# Patient Record
Sex: Male | Born: 1950 | Race: Black or African American | Hispanic: No | Marital: Single | Smoking: Never smoker
Health system: Southern US, Community
[De-identification: ages and names within clinical notes are randomized; demographics above are authoritative.]

## PROBLEM LIST (undated history)

## (undated) DIAGNOSIS — K648 Other hemorrhoids: Secondary | ICD-10-CM

## (undated) DIAGNOSIS — T5891XA Toxic effect of carbon monoxide from unspecified source, accidental (unintentional), initial encounter: Secondary | ICD-10-CM

## (undated) DIAGNOSIS — R112 Nausea with vomiting, unspecified: Secondary | ICD-10-CM

## (undated) DIAGNOSIS — R339 Retention of urine, unspecified: Secondary | ICD-10-CM

## (undated) DIAGNOSIS — E785 Hyperlipidemia, unspecified: Secondary | ICD-10-CM

## (undated) DIAGNOSIS — R55 Syncope and collapse: Secondary | ICD-10-CM

## (undated) DIAGNOSIS — Z9889 Other specified postprocedural states: Secondary | ICD-10-CM

## (undated) HISTORY — DX: Hyperlipidemia, unspecified: E78.5

## (undated) HISTORY — PX: FINGER FRACTURE SURGERY: SHX638

## (undated) HISTORY — PX: CLOSED REDUCTION SHOULDER DISLOCATION: SUR242

## (undated) HISTORY — PX: PROSTATE BIOPSY: SHX241

## (undated) HISTORY — PX: FRACTURE SURGERY: SHX138

---

## 2003-09-24 ENCOUNTER — Emergency Department (HOSPITAL_COMMUNITY): Admission: EM | Admit: 2003-09-24 | Discharge: 2003-09-24 | Payer: Self-pay | Admitting: Emergency Medicine

## 2008-03-14 ENCOUNTER — Observation Stay (HOSPITAL_COMMUNITY): Admission: EM | Admit: 2008-03-14 | Discharge: 2008-03-15 | Payer: Self-pay | Admitting: Emergency Medicine

## 2008-03-16 ENCOUNTER — Emergency Department (HOSPITAL_COMMUNITY): Admission: EM | Admit: 2008-03-16 | Discharge: 2008-03-16 | Payer: Self-pay | Admitting: Emergency Medicine

## 2010-05-24 LAB — URINALYSIS, ROUTINE W REFLEX MICROSCOPIC
Bilirubin Urine: NEGATIVE
Bilirubin Urine: NEGATIVE
Glucose, UA: NEGATIVE mg/dL
Ketones, ur: NEGATIVE mg/dL
Ketones, ur: NEGATIVE mg/dL
Nitrite: NEGATIVE
Protein, ur: 30 mg/dL — AB
Specific Gravity, Urine: 1.013 (ref 1.005–1.030)
Specific Gravity, Urine: 1.02 (ref 1.005–1.030)
Urobilinogen, UA: 1 mg/dL (ref 0.0–1.0)
Urobilinogen, UA: 1 mg/dL (ref 0.0–1.0)
pH: 6.5 (ref 5.0–8.0)

## 2010-05-24 LAB — URINE MICROSCOPIC-ADD ON

## 2010-05-24 LAB — POCT I-STAT, CHEM 8
BUN: 12 mg/dL (ref 6–23)
Calcium, Ion: 1.19 mmol/L (ref 1.12–1.32)
Creatinine, Ser: 1.4 mg/dL (ref 0.4–1.5)
Glucose, Bld: 119 mg/dL — ABNORMAL HIGH (ref 70–99)
Hemoglobin: 13.9 g/dL (ref 13.0–17.0)
Sodium: 142 mEq/L (ref 135–145)
TCO2: 24 mmol/L (ref 0–100)

## 2010-05-24 LAB — CULTURE, ROUTINE-ABSCESS: Gram Stain: NONE SEEN

## 2010-05-24 LAB — DIFFERENTIAL
Basophils Absolute: 0 10*3/uL (ref 0.0–0.1)
Eosinophils Relative: 6 % — ABNORMAL HIGH (ref 0–5)
Lymphocytes Relative: 47 % — ABNORMAL HIGH (ref 12–46)
Lymphs Abs: 1.8 10*3/uL (ref 0.7–4.0)
Neutro Abs: 1.4 10*3/uL — ABNORMAL LOW (ref 1.7–7.7)
Neutrophils Relative %: 36 % — ABNORMAL LOW (ref 43–77)

## 2010-05-24 LAB — URINE CULTURE: Colony Count: NO GROWTH

## 2010-05-24 LAB — BASIC METABOLIC PANEL
BUN: 11 mg/dL (ref 6–23)
Calcium: 9.5 mg/dL (ref 8.4–10.5)
Creatinine, Ser: 1.01 mg/dL (ref 0.4–1.5)
GFR calc non Af Amer: >60 mL/min (ref >60)
Glucose, Bld: 93 mg/dL (ref 70–99)
Potassium: 3.7 mEq/L (ref 3.5–5.1)

## 2010-05-24 LAB — CBC
HCT: 40.9 % (ref 39.0–52.0)
Platelets: 188 10*3/uL (ref 150–400)
RDW: 15.1 % (ref 11.5–15.5)
WBC: 3.9 10*3/uL — ABNORMAL LOW (ref 4.0–10.5)

## 2010-06-21 NOTE — Op Note (Signed)
NAMEGLENDEL, Dan Mathis               ACCOUNT NO.:  [id number]   MEDICAL RECORD NO.:  [id number]          PATIENT TYPE:  INP   LOCATION:  2550                         FACILITY:  [facility]   PHYSICIAN:  Dionne Ano. Gramig, M.D.DATE OF BIRTH:  1950/12/28   DATE OF PROCEDURE:  DATE OF DISCHARGE:                               OPERATIVE REPORT   PREOPERATIVE DIAGNOSIS:  Late presentation of left index finger fracture  with laceration of the dorsal aspect, suspicious for open fracture/type  1 open fracture left index finger.   POSTOPERATIVE DIAGNOSIS:  Late presentation of left index finger  fracture with laceration of the dorsal aspect, suspicious for open  fracture/type 1 open fracture left index finger.   PROCEDURE:  1. I&D (irrigation and debridement) skin, subcutaneous tissue, tendon,      and bone.  This was an excisional debridement of a late      presentation of an open fracture.  2. Open reduction and internal fixation, left index finger fracture.  3. Stress radiography.   SURGEON:  Dionne Ano. Amanda Pea, MD   ASSISTANTS:  None.   COMPLICATIONS:  None.   ANESTHESIA:  General.   ESTIMATED BLOOD LOSS:  Minimal.   INDICATIONS FOR PROCEDURE:  The patient is a 60 year old male who  presents with the above-mentioned diagnosis.  I have counseled him in  regards to risks and benefits of surgery including risk of infection,  bleeding, anesthesia, damage to normal structures, and failure of  surgery to accomplish its intended goals of relieving symptoms and  restoring function.  With this in mind, he desired to proceed.  All  questions have been encouraged and answered preoperatively.   I should note the patient does have a late presentation, and it is  difficult to ascertain the nature of the injury and onset in terms of  the depth of penetration, but certainly he has areas with dorsal  laceration suspicious for an open fracture.  Given the displacement,  laceration, vicinity etc., I do  feel this is a late presentation of an  open fracture, which is not obviously infected at this point.  I have  discussed with the patient the need for I&D, ORIF, and repair of  structures as necessary.  With this in mind, the patient desires to  proceed.  All questions have been encouraged and answered  preoperatively.   OPERATIVE PROCEDURE:  The patient was seen by myself, anesthesia, taken  to the operative suite and underwent a smooth induction of anesthesia.  Preoperatively, time-out was called.  Consent verified and all questions  were encouraged and answered.  Once in the operative arena, the patient  underwent a smooth induction of general anesthesia.  He was laid supine,  appropriately padded, prepped and draped in usual sterile fashion,  Betadine scrub and paint about the left upper extremity.  This was  performed to my satisfaction without difficulty.  Following this, the  patient then underwent incision and excision on nature about the prior  dorsal laceration.  Dissection was carried down.  There was large amount  of abundant scar tissue.  There was no  abscess or gross purulence.  I  did culture this tissue.  He had exuberant periosteal tissue and  inflammation which was cultured.  Both aerobic and anaerobic cultures  were taken.  I&D the area copiously with 3 L of saline.   Following this, I then performed open reduction of the middle phalanx  fracture.  This was secured with a 0.035 K-wire entering at the tip of  the finger and crossing the DIP joint securing the fracture.  This  provided excellent fixation.  The was placed underneath the skin for  later removal.  This was done without difficulty.  Stress radiography  revealed excellent position.  PIP joint was stable.  There were no  complicating features.   The patient tolerated the procedure well.  He was dressed sterilely with  Xeroform and a splint after sterile dressing was placed.  He was taken  to the recovery  room in stable condition.  He will be discharged home on  antibiotics in the form of Bactrim and Keflex, Percocet for pain,  vitamin C, Peri-Colace according to my usual routine and return to the  office to see me in 7 days.  I have discussed with him the relevant do's  and don'ts etc., and all questions of course have been encouraged and  answered.  It was a pleasure to see him today.  Hopefully, this will  return him to quiescent state of affairs.  He understands and he needs  to keep this clean and dry.  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  It was a  pleasure to see him today.  By the time of first postop visit , I will  need  AP and lateral x-rays, and he will need a splint to be applied to  continue immobilization.  We will begin PIP and MCP range of motion as  he tolerates the course.      Dionne Ano. Amanda Pea, M.D.  Electronically Signed     WMG/MEDQ  D:  03/14/2008  T:  03/15/2008  Job:  [id number]

## 2010-06-21 NOTE — Consult Note (Signed)
Dan Mathis, Dan Mathis               ACCOUNT NO.:  [id number]   MEDICAL RECORD NO.:  [id number]          PATIENT TYPE:  EMS   LOCATION:  MAJO                         FACILITY:  [facility]   PHYSICIAN:  Maretta Bees. Vonita Moss, M.D.DATE OF BIRTH:  Jul 22, 1950   DATE OF CONSULTATION:  03/16/2008  DATE OF DISCHARGE:  03/16/2008                                 CONSULTATION   I was called by the emergency room staff to see this gentleman because  of inability to void and inability to catheterize him.  He had hand  surgery about 4 days ago and went home and came back in urinary  retention.  This morning apparently he was catheterized, in and out, and  sent home.  He came back because he could not void.  The staff could not  insert a Foley catheter on three attempts.   He did have a history back in 2005 of urinary retention and difficult  catheterization and cystoscopy by Dr. Brunilda Payor.  He was on Uroxatral for a  while, but says he has not been on that.  He says before this episode of  retention, he was voiding satisfactorily with nocturia x1.   PAST MEDICAL HISTORY:  Reveals no serious illnesses.   MEDICATIONS:  He takes Septra and Keflex and oxycodone in reference to  the recent hand surgery.   He is also on vitamins.   ALLERGIES:  Allergies to drugs he denied.   SOCIAL HISTORY:  Tobacco none.  Alcohol none.   PHYSICAL EXAMINATION:  GENERAL:  He is a gentleman in acute distress.  He is otherwise alert and oriented.  No respiratory distress.  ABDOMEN:  Remarkable for suprapubic tenderness and guarding.  EXTERNAL GENITALIA:  Penis, urethral meatus, testicles, and epididymis  remarkable for blood per urethral meatus.   I attempted to pass an 18-French Coude catheter, but could not get it  past the bladder neck.  I did not get return of urine.  In view of that  fact and the previous difficulty Dr. Brunilda Payor had in 2005, I decided he  needed cystoscopy.   PROCEDURE:  After prepping the patient through his  Betadine and sterile  towels, I performed flexible cystoscopy and he had some blood clots in  the urethra and blood clots in the prostatic urethra and fortunately the  scope was easily passed into the bladder and a part of the bladder wall  was visualized to determine good placement of the cystoscope.  I then  inserted a sensor guidewire into the bladder and removed the cystoscope.  Over the indwelling guidewire, I inserted an 18-French [place] tipped  catheter and drained several hundred mL of clear urine from the bladder.  The catheter was left indwelling.   IMPRESSION:  1. Acute urinary retention.  2. Bladder outlet obstruction.   PLAN:  Urine was cultured.  He was given 80 mg of tobramycin IM and he  will continue on his other antibiotics.  I gave him a prescription for  Flomax.  His visit to see Dr. Brunilda Payor tomorrow will be deferred 2 more  days.  Maretta Bees. Vonita Moss, M.D.  Electronically Signed     LJP/MEDQ  D:  03/16/2008  T:  03/17/2008  Job:  [id number]

## 2010-06-24 NOTE — Consult Note (Signed)
Dan Mathis, Dan Mathis                           ACCOUNT NO.:  [id number]   MEDICAL RECORD NO.:  [id number]                   PATIENT TYPE:  EMS   LOCATION:  MAJO                                 FACILITY:  [facility]   PHYSICIAN:  Lindaann Slough, M.D.               DATE OF BIRTH:  Mar 02, 1950   DATE OF CONSULTATION:  09/24/2003  DATE OF DISCHARGE:                                   CONSULTATION   REASON FOR CONSULTATION:  Difficulty voiding and inability to insert Foley  catheter.   HISTORY OF PRESENT ILLNESS:  The patient is a 60 year old male who was seen  in the emergency room today complaining of inability to urinate.  He states  that over the past few days he has been having a slow stream, frequency and  straining on urination; and, today he has been unable to urinate.  The  nurses passed a Foley catheter in the bladder and inflated the balloon.  Then he started having more pain and the Foley catheter was removed.  He  started having bleeding through the urethra and I was then asked to come in  and insert the Foley catheter.   I tried to insert a #16 coude catheter in the bladder, but then the catheter  could not be passed in the bladder.  A cystoscope was then passed in the  urethra.  There were some blood clots in the urethra, but a Glidewire was  passed to hold the cystoscope and into the bladder.  Then the cystoscope was  removed.  The urethra was then dilated with [facility] dilators and then a #20  [place] catheter was passed in the bladder.  Five-hundred  milliliters of urine were drained out of the bladder and the catheter was  left to straight drainage.   PAST MEDICAL HISTORY:  The past medical history is negative for hypertension  or diabetes.   ALLERGIES:  The patient has no known drug allergies.   MEDICATIONS:  The patient is on no medication.   FAMILY HISTORY:  Father and mother are alive and well.   SOCIAL HISTORY:  The patient is single and had one child.   Does not smoke  nor drink.   REVIEW OF SYSTEMS:  Review of systems is negative, except for his voiding  symptoms.   PHYSICAL EXAMINATION:  GENERAL APPEARANCE:  This is a well build 60 year old  male in pain secondary to inability to urinate.  ABDOMEN:  The patient's abdomen is soft, but tender in the suprapubic area.  BACK:  The patient has no CVA tenderness and kidneys are not palpable.  GENITALIA:  Penis is uncircumcised.  Meatus is normal.  Scrotum is normal in  appearance.  There is no hydrocele.  No testicular mass.  __________ within  normal limits.  RECTAL:  On rectal examination sphincter tone is normal.  Prostate is  enlarged, 50 grams, no nodules.  Seminal vesicles  are not palpable.   PLAN:  1. Leave the Foley catheter indwelling.  2. UA and C&S.  3. Levaquin 250 mg p.o. daily.  4. Follow up in the office next week.  At that time we will obtain a PSA and     a new cystoscopy.  Further treatment will depend on the findings.                                               Lindaann Slough, M.D.    MN/MEDQ  D:  09/24/2003  T:  09/26/2003  Job:  [id number]

## 2014-02-06 DIAGNOSIS — R55 Syncope and collapse: Secondary | ICD-10-CM

## 2014-02-06 HISTORY — DX: Syncope and collapse: R55

## 2014-11-05 ENCOUNTER — Emergency Department (HOSPITAL_COMMUNITY): Payer: PPO

## 2014-11-05 ENCOUNTER — Encounter (HOSPITAL_COMMUNITY): Payer: Self-pay | Admitting: Emergency Medicine

## 2014-11-05 ENCOUNTER — Observation Stay (HOSPITAL_COMMUNITY)
Admission: EM | Admit: 2014-11-05 | Discharge: 2014-11-06 | Disposition: A | Discharge status: Home / Self Care | Payer: PPO | Attending: Internal Medicine | Admitting: Internal Medicine

## 2014-11-05 DIAGNOSIS — T5894XA Toxic effect of carbon monoxide from unspecified source, undetermined, initial encounter: Secondary | ICD-10-CM

## 2014-11-05 DIAGNOSIS — N183 Chronic kidney disease, stage 3 unspecified: Secondary | ICD-10-CM | POA: Diagnosis present

## 2014-11-05 DIAGNOSIS — R55 Syncope and collapse: Secondary | ICD-10-CM | POA: Diagnosis not present

## 2014-11-05 DIAGNOSIS — E872 Acidosis, unspecified: Secondary | ICD-10-CM | POA: Diagnosis present

## 2014-11-05 DIAGNOSIS — R7989 Other specified abnormal findings of blood chemistry: Secondary | ICD-10-CM

## 2014-11-05 DIAGNOSIS — T5891XA Toxic effect of carbon monoxide from unspecified source, accidental (unintentional), initial encounter: Secondary | ICD-10-CM

## 2014-11-05 DIAGNOSIS — R112 Nausea with vomiting, unspecified: Secondary | ICD-10-CM

## 2014-11-05 DIAGNOSIS — T597X1A Toxic effect of carbon dioxide, accidental (unintentional), initial encounter: Secondary | ICD-10-CM | POA: Diagnosis not present

## 2014-11-05 DIAGNOSIS — J449 Chronic obstructive pulmonary disease, unspecified: Secondary | ICD-10-CM | POA: Diagnosis not present

## 2014-11-05 HISTORY — DX: Toxic effect of carbon monoxide from unspecified source, accidental (unintentional), initial encounter: T58.91XA

## 2014-11-05 LAB — URINALYSIS, ROUTINE W REFLEX MICROSCOPIC
BILIRUBIN URINE: NEGATIVE
GLUCOSE, UA: NEGATIVE mg/dL
HGB URINE DIPSTICK: NEGATIVE
Ketones, ur: 15 mg/dL — AB
Leukocytes, UA: NEGATIVE
Nitrite: NEGATIVE
PH: 6 (ref 5.0–8.0)
Protein, ur: NEGATIVE mg/dL
SPECIFIC GRAVITY, URINE: 1.025 (ref 1.005–1.030)
UROBILINOGEN UA: 1 mg/dL (ref 0.0–1.0)

## 2014-11-05 LAB — CBC
HEMATOCRIT: 35 % — AB (ref 39.0–52.0)
Hemoglobin: 11.9 g/dL — ABNORMAL LOW (ref 13.0–17.0)
MCH: 30 pg (ref 26.0–34.0)
MCHC: 34 g/dL (ref 30.0–36.0)
MCV: 88.2 fL (ref 78.0–100.0)
Platelets: 163 10*3/uL (ref 150–400)
RBC: 3.97 MIL/uL — ABNORMAL LOW (ref 4.22–5.81)
RDW: 15.4 % (ref 11.5–15.5)
WBC: 6.4 10*3/uL (ref 4.0–10.5)

## 2014-11-05 LAB — CARBOXYHEMOGLOBIN
CARBOXYHEMOGLOBIN: 13.8 % — AB (ref 0.5–1.5)
CARBOXYHEMOGLOBIN: 4.7 % — AB (ref 0.5–1.5)
METHEMOGLOBIN: 1.1 % (ref 0.0–1.5)
METHEMOGLOBIN: 1 % (ref 0.0–1.5)
O2 SAT: 50.9 %
O2 Saturation: 52.3 %
Total hemoglobin: 11.5 g/dL — ABNORMAL LOW (ref 13.5–18.0)
Total hemoglobin: 11.7 g/dL — ABNORMAL LOW (ref 13.5–18.0)

## 2014-11-05 LAB — ETHANOL: Alcohol, Ethyl (B): <5 mg/dL (ref <5)

## 2014-11-05 LAB — BASIC METABOLIC PANEL
ANION GAP: 8 (ref 5–15)
Anion gap: 13 (ref 5–15)
BUN: 16 mg/dL (ref 6–20)
BUN: 16 mg/dL (ref 6–20)
CALCIUM: 8.8 mg/dL — AB (ref 8.9–10.3)
CO2: 19 mmol/L — ABNORMAL LOW (ref 22–32)
CO2: 21 mmol/L — ABNORMAL LOW (ref 22–32)
Calcium: 9.3 mg/dL (ref 8.9–10.3)
Chloride: 108 mmol/L (ref 101–111)
Chloride: 110 mmol/L (ref 101–111)
Creatinine, Ser: 1.55 mg/dL — ABNORMAL HIGH (ref 0.61–1.24)
Creatinine, Ser: 1.38 mg/dL — ABNORMAL HIGH (ref 0.61–1.24)
GFR calc Af Amer: 52 mL/min — ABNORMAL LOW (ref >60)
GFR calc non Af Amer: 52 mL/min — ABNORMAL LOW (ref >60)
GFR, EST AFRICAN AMERICAN: 60 mL/min — AB (ref >60)
GFR, EST NON AFRICAN AMERICAN: 45 mL/min — AB (ref >60)
GLUCOSE: 130 mg/dL — AB (ref 65–99)
GLUCOSE: 98 mg/dL (ref 65–99)
POTASSIUM: 3.7 mmol/L (ref 3.5–5.1)
POTASSIUM: 4.8 mmol/L (ref 3.5–5.1)
Sodium: 140 mmol/L (ref 135–145)
Sodium: 139 mmol/L (ref 135–145)

## 2014-11-05 LAB — RAPID URINE DRUG SCREEN, HOSP PERFORMED
Amphetamines: NOT DETECTED
BARBITURATES: NOT DETECTED
Benzodiazepines: NOT DETECTED
Cocaine: NOT DETECTED
OPIATES: NOT DETECTED
TETRAHYDROCANNABINOL: NOT DETECTED

## 2014-11-05 LAB — DIFFERENTIAL
BASOS ABS: 0 10*3/uL (ref 0.0–0.1)
BASOS PCT: 0 %
EOS ABS: 0.1 10*3/uL (ref 0.0–0.7)
Eosinophils Relative: 1 %
Lymphocytes Relative: 19 %
Lymphs Abs: 1.1 10*3/uL (ref 0.7–4.0)
Monocytes Absolute: 0.3 10*3/uL (ref 0.1–1.0)
Monocytes Relative: 5 %
NEUTROS ABS: 4.6 10*3/uL (ref 1.7–7.7)
NEUTROS PCT: 75 %

## 2014-11-05 LAB — PROTIME-INR
INR: 1.23 (ref 0.00–1.49)
PROTHROMBIN TIME: 15.7 s — AB (ref 11.6–15.2)

## 2014-11-05 LAB — I-STAT CG4 LACTIC ACID, ED
LACTIC ACID, VENOUS: 1.07 mmol/L (ref 0.5–2.0)
Lactic Acid, Venous: 3.8 mmol/L (ref 0.5–2.0)

## 2014-11-05 LAB — I-STAT TROPONIN, ED: TROPONIN I, POC: 0 ng/mL (ref 0.00–0.08)

## 2014-11-05 LAB — APTT: APTT: 28 s (ref 24–37)

## 2014-11-05 MED ORDER — SODIUM CHLORIDE 0.9 % IV SOLN
INTRAVENOUS | Status: DC
  Administered 2014-11-06: via INTRAVENOUS

## 2014-11-05 MED ORDER — ONDANSETRON HCL 4 MG PO TABS: 4.0000 mg | ORAL_TABLET | Freq: Four times a day (QID) | ORAL | Status: DC | PRN

## 2014-11-05 MED ORDER — SODIUM CHLORIDE 0.9 % IJ SOLN: 3.0000 mL | Freq: Two times a day (BID) | INTRAMUSCULAR | Status: DC

## 2014-11-05 MED ORDER — ONDANSETRON HCL 4 MG/2ML IJ SOLN
4.0000 mg | Freq: Once | INTRAMUSCULAR | Status: AC
  Administered 2014-11-05: 4 mg via INTRAVENOUS
  Filled 2014-11-05: qty 2

## 2014-11-05 MED ORDER — ONDANSETRON HCL 4 MG/2ML IJ SOLN: 4.0000 mg | Freq: Four times a day (QID) | INTRAMUSCULAR | Status: DC | PRN

## 2014-11-05 MED ORDER — SODIUM CHLORIDE 0.9 % IV BOLUS (SEPSIS)
1000.0000 mL | Freq: Once | INTRAVENOUS | Status: AC
  Administered 2014-11-05: 1000 mL via INTRAVENOUS

## 2014-11-05 MED ORDER — HEPARIN SODIUM (PORCINE) 5000 UNIT/ML IJ SOLN: 5000.0000 [IU] | Freq: Three times a day (TID) | INTRAMUSCULAR | Status: DC

## 2014-11-05 MED ORDER — ACETAMINOPHEN 650 MG RE SUPP: 650.0000 mg | Freq: Four times a day (QID) | RECTAL | Status: DC | PRN

## 2014-11-05 MED ORDER — ACETAMINOPHEN 325 MG PO TABS: 650.0000 mg | ORAL_TABLET | Freq: Four times a day (QID) | ORAL | Status: DC | PRN

## 2014-11-05 NOTE — ED Provider Notes (Signed)
CSN: [id number]     Arrival date & time 11/05/14  1641 History   First MD Initiated Contact with Patient 11/05/14 1649     Chief Complaint  Patient presents with  . Loss of Consciousness     (Consider location/radiation/quality/duration/timing/severity/associated sxs/prior Treatment) HPI Comments: Dan Mathis is a 64 y.o. male who presents to the ED with complaints of syncopal episode prior to arrival. He states he was using a saw which is driven by propane inside the building, and he felt diaphoretic and lightheaded, stating that he walked outside to get "some fresh air" and had a witnessed syncopal episode lasting approximately 2 minutes according to bystanders. He denies completely losing consciousness, but is unclear whether he did or not. He endorses nausea and 5 episodes of nonbloody nonbilious emesis prior to arrival. Feels generally weak now, stating that he has a "woozy" feeling. He has no medical problems and takes no medications, he is a nonsmoker.  He denies any fevers, chills, chest pain, shortness breath, leg swelling, recent travel/surgery/immobilization, family or personal history of DVT/PE, abdominal pain, diarrhea, constipation, melena, hematochezia, dysuria, hematuria, numbness, tingling, or weakness. Denies any vertiginous symptoms. Denies headache or vision changes.  Patient is a 64 y.o. male presenting with syncope. The history is provided by the patient. No language interpreter was used.  Loss of Consciousness Episode history:  Single Most recent episode:  Today Duration:  2 minutes Timing:  Unable to specify Progression:  Resolved Chronicity:  New Context comment:  Using a propane driven saw inside Witnessed: yes   Relieved by:  None tried Worsened by:  Nothing tried Ineffective treatments:  None tried Associated symptoms: diaphoresis, malaise/fatigue, nausea and vomiting   Associated symptoms: no chest pain, no confusion, no difficulty breathing, no fever, no  focal sensory loss, no focal weakness, no headaches, no recent injury, no shortness of breath, no visual change and no weakness     History reviewed. No pertinent past medical history. History reviewed. No pertinent past surgical history. No family history on file. Social History  Substance Use Topics  . Smoking status: Never Smoker   . Smokeless tobacco: None  . Alcohol Use: No    Review of Systems  Constitutional: Positive for malaise/fatigue, diaphoresis and fatigue (generalized weakness). Negative for fever and chills.  Eyes: Negative for visual disturbance.  Respiratory: Negative for shortness of breath.   Cardiovascular: Positive for syncope. Negative for chest pain and leg swelling.  Gastrointestinal: Positive for nausea and vomiting. Negative for abdominal pain, diarrhea, constipation and blood in stool.  Genitourinary: Negative for dysuria and hematuria.  Musculoskeletal: Negative for myalgias and arthralgias.  Skin: Negative for color change.  Allergic/Immunologic: Negative for immunocompromised state.  Neurological: Positive for syncope and light-headedness. Negative for focal weakness, weakness, numbness and headaches.  Psychiatric/Behavioral: Negative for confusion.   10 Systems reviewed and are negative for acute change except as noted in the HPI.    Allergies  Review of patient's allergies indicates no known allergies.  Home Medications   Prior to Admission medications   Not on File   BP 114/67 mmHg  Pulse 83  Temp(Src) 98.2 F (36.8 C) (Oral)  Resp 20  Ht 5\' 9"  (1.753 m)  Wt 180 lb (81.647 kg)  BMI 26.57 kg/m2  SpO2 99% Physical Exam  Constitutional: He is oriented to person, place, and time. Vital signs are normal. He appears well-developed and well-nourished.  Non-toxic appearance. No distress.  Afebrile, nontoxic, NAD  HENT:  Head: Normocephalic and atraumatic.  Mouth/Throat: Oropharynx is clear and moist and mucous membranes are normal.  Eyes:  Conjunctivae and EOM are normal. Pupils are equal, round, and reactive to light. Right eye exhibits no discharge. Left eye exhibits no discharge.  PERRL, EOMI, no nystagmus, no visual field deficits   Neck: Normal range of motion. Neck supple.  Cardiovascular: Normal rate, regular rhythm, normal heart sounds and intact distal pulses.  Exam reveals no gallop and no friction rub.   No murmur heard. RRR, nl s1/s2, no m/r/g, distal pulses intact, no pedal edema   Pulmonary/Chest: Effort normal and breath sounds normal. No respiratory distress. He has no decreased breath sounds. He has no wheezes. He has no rhonchi. He has no rales.  CTAB in all lung fields, no w/r/r, no hypoxia or increased WOB, speaking in full sentences, SpO2 99% on RA   Abdominal: Soft. Normal appearance and bowel sounds are normal. He exhibits no distension. There is no tenderness. There is no rigidity, no rebound, no guarding, no CVA tenderness, no tenderness at McBurney's point and negative Murphy's sign.  Musculoskeletal: Normal range of motion.  MAE x4 Strength and sensation grossly intact Distal pulses intact Gait not assessed initially due to pt feeling generally weak  Neurological: He is alert and oriented to person, place, and time. He has normal strength. No cranial nerve deficit or sensory deficit. Coordination normal. GCS eye subscore is 4. GCS verbal subscore is 5. GCS motor subscore is 6.  CN 2-12 grossly intact A&O x4 GCS 15 Sensation and strength intact Gait not initially assessed due to pt feeling generally weak Coordination with finger-to-nose WNL Neg pronator drift   Skin: Skin is warm, dry and intact. No rash noted.  Psychiatric: He has a normal mood and affect.  Nursing note and vitals reviewed.   ED Course  Procedures (including critical care time)  18:42 Orthostatic Vital Signs KP  Orthostatic Lying  - BP- Lying: 122/68 mmHg ; Pulse- Lying: 81  Orthostatic Sitting - BP- Sitting: 114/59 mmHg ;  Pulse- Sitting: 87  Orthostatic Standing at 0 minutes - BP- Standing at 0 minutes: 112/72 mmHg ; Pulse- Standing at 0 minutes: 89       Labs Review Labs Reviewed  BASIC METABOLIC PANEL - Abnormal; Notable for the following:    CO2 19 (*)    Glucose, Bld 130 (*)    Creatinine, Ser 1.55 (*)    GFR calc non Af Amer 45 (*)    GFR calc Af Amer 52 (*)    All other components within normal limits  CBC - Abnormal; Notable for the following:    RBC 3.97 (*)    Hemoglobin 11.9 (*)    HCT 35.0 (*)    All other components within normal limits  URINALYSIS, ROUTINE W REFLEX MICROSCOPIC (NOT AT [facility]) - Abnormal; Notable for the following:    Ketones, ur 15 (*)    All other components within normal limits  PROTIME-INR - Abnormal; Notable for the following:    Prothrombin Time 15.7 (*)    All other components within normal limits  CARBOXYHEMOGLOBIN - Abnormal; Notable for the following:    Total hemoglobin 11.5 (*)    Carboxyhemoglobin 13.8 (*)    All other components within normal limits  CARBOXYHEMOGLOBIN - Abnormal; Notable for the following:    Total hemoglobin 11.7 (*)    Carboxyhemoglobin 4.7 (*)    All other components within normal limits  I-STAT CG4 LACTIC ACID, ED - Abnormal; Notable for the following:  Lactic Acid, Venous 3.80 (*)    All other components within normal limits  ETHANOL  APTT  DIFFERENTIAL  URINE RAPID DRUG SCREEN, HOSP PERFORMED  BASIC METABOLIC PANEL  CBG MONITORING, ED  I-STAT TROPOININ, ED  I-STAT CG4 LACTIC ACID, ED    Imaging Review Ct Head Wo Contrast  11/05/2014   CLINICAL DATA:  Syncope. Acute onset of dizziness and diaphoresis prior to the syncopal event. Subsequent vomiting and nausea.  EXAM: CT HEAD WITHOUT CONTRAST  TECHNIQUE: Contiguous axial images were obtained from the base of the skull through the vertex without intravenous contrast.  COMPARISON:  None.  FINDINGS: No intracranial hemorrhage, mass effect, or midline shift. No hydrocephalus.  The basilar cisterns are patent. No evidence of territorial infarct. No intracranial fluid collection. Calvarium is intact. Included paranasal sinuses and mastoid air cells are well aerated.  IMPRESSION: No acute intracranial abnormality.   Electronically Signed   By: Jeb Levering M.D.   On: 11/05/2014 19:15   I have personally reviewed and evaluated these images and lab results as part of my medical decision-making.   EKG Interpretation None      MDM   Final diagnoses:  Syncope and collapse  Non-intractable vomiting with nausea, vomiting of unspecified type  Carbon monoxide poisoning, initial encounter  Elevated lactic acid level    64 y.o. male here with syncopal episode while working with a propane-driven saw inside a building. Feels generally weak, but no focal deficits noted on neuro exam. No pain reported. N/V prior to arrival, will give fluids and nausea meds, and well obtain labs including carboxyhemoglobin to evaluate for CO poisoning. Will get head CT as well. Will place on oxygen 100% via NRB. Will reassess shortly.   7:35 PM Carboxyhgb elevated at 13.8. 100% oxygen via nonrebreather not initially done, started now. Lactic acid 3.8, fluids running. Orthostatic VS neg. Trop neg. BMP with mildly low CO2 at 19, Cr 1.55. CBC with mild anemia. EtOH neg. INR and APTT neg. U/A and UDS pending. Will repeat lactic and carboxyhgb in 2hrs. CT head neg. EKG unremarkable.   10:38 PM Carboxyhgb 4.7 but still elevated, lactic WNL. Repeat BMP pending. Given persistent carboxyhgb level, will admit for obs. UDS and U/A neg.   10:58 PM Dr. Posey Pronto of [facility] returning page, will admit. Please see his notes for further documentation of care.  BP 117/63 mmHg  Pulse 72  Temp(Src) 98.2 F (36.8 C) (Oral)  Resp 21  Ht 5\' 9"  (1.753 m)  Wt 180 lb (81.647 kg)  BMI 26.57 kg/m2  SpO2 100%  Meds ordered this encounter  Medications  . sodium chloride 0.9 % bolus 1,000 mL    Sig:   .  ondansetron (ZOFRAN) injection 4 mg    Sig:      Mercedes Camprubi-Soms, PA-C 11/05/14 2259  Courteney Lyn Mackuen, MD 11/05/14 2359

## 2014-11-05 NOTE — H&P (Signed)
[facility] History and Physical  Patient: Dan Mathis  MRN: [id number]  DOB: 03/28/1950  DOS: the patient was seen and examined on 11/05/2014 PCP: No PCP Per Patient  Referring physician:  Dr. Sandi Mariscal Chief Complaint: Loss of consciousness  HPI: Burle Kwan is a 64 y.o. male with no significant Past medical history. The patient is presenting with complains of a syncopal event. The patient mentions that he was working on a saw which is propane driven inside of building. He felt dizzy and lightheaded as well as having some sweating sensations. He went outside to get some fresh air. He felt significantly weak and passed out. There were by 6 cm thought that he was passed out for more than 2 minutes. Patient denies completely losing consciousness. Patient denies having any head injury or neck injury. At the time of my evaluation he denies any complaints of chest pain and abdominal pain nausea vomiting diarrhea headache focal deficit burning urination constipation. He does not take any medications at his baseline. He denies any alcohol drinking or drug abuse.  The patient is coming from home.  At his baseline ambulates without support And is independent for most of his ADL; manages his medication on his own.  Review of Systems: as mentioned in the history of present illness.  A comprehensive review of the other systems is negative.  Past Medical History  Diagnosis Date  . Carbon monoxide poisoning 11/05/2014    using a saw which is driven by propane inside the building   History reviewed. No pertinent past surgical history. Social History:  reports that he has never smoked. He does not have any smokeless tobacco history on file. He reports that he does not drink alcohol or use illicit drugs.  No Known Allergies  No family history on file.  Prior to Admission medications   Not on File    Physical Exam: Filed Vitals:   11/05/14 2200 11/05/14 2230 11/05/14 2300 11/05/14  2336  BP: 117/63 118/74 98/86 139/76  Pulse: 72 74 76 87  Temp:    99.1 F (37.3 C)  TempSrc:    Oral  Resp: 21 23 20 28   Height:      Weight:      SpO2: 100% 100% 100% 100%    General: Alert, Awake and Oriented to Time, Place and Person. Appear in mild distress Eyes: PERRL ENT: Oral Mucosa clear moist. Neck: no JVD Cardiovascular: S1 and S2 Present, no Murmur, Peripheral Pulses Present Respiratory: Bilateral Air entry equal and Decreased,  Clear to Auscultation, no Crackles, no wheezes Abdomen: Bowel Sound present, Soft and no tenderness Skin: no Rash Extremities: no Pedal edema, no calf tenderness Neurologic: Grossly no focal neuro deficit.  Labs on Admission:  CBC:  Recent Labs Lab 11/05/14 1718  WBC 6.4  NEUTROABS 4.6  HGB 11.9*  HCT 35.0*  MCV 88.2  PLT 163    CMP     Component Value Date/Time   NA 139 11/05/2014 2211   K 4.8 11/05/2014 2211   CL 110 11/05/2014 2211   CO2 21* 11/05/2014 2211   GLUCOSE 98 11/05/2014 2211   BUN 16 11/05/2014 2211   CREATININE 1.38* 11/05/2014 2211   CALCIUM 8.8* 11/05/2014 2211   GFRNONAA 52* 11/05/2014 2211   GFRAA 60* 11/05/2014 2211    No results for input(s): CKTOTAL, CKMB, CKMBINDEX, TROPONINI in the last 168 hours. BNP (last 3 results) No results for input(s): BNP in the last 8760 hours.  ProBNP (last 3 results)  No results for input(s): PROBNP in the last 8760 hours.   Radiological Exams on Admission: Ct Head Wo Contrast  11/05/2014   CLINICAL DATA:  Syncope. Acute onset of dizziness and diaphoresis prior to the syncopal event. Subsequent vomiting and nausea.  EXAM: CT HEAD WITHOUT CONTRAST  TECHNIQUE: Contiguous axial images were obtained from the base of the skull through the vertex without intravenous contrast.  COMPARISON:  None.  FINDINGS: No intracranial hemorrhage, mass effect, or midline shift. No hydrocephalus. The basilar cisterns are patent. No evidence of territorial infarct. No intracranial fluid  collection. Calvarium is intact. Included paranasal sinuses and mastoid air cells are well aerated.  IMPRESSION: No acute intracranial abnormality.   Electronically Signed   By: Jeb Levering M.D.   On: 11/05/2014 19:15   Assessment/Plan 1. Carbon monoxide poisoning The patient is presenting with complaints of a passing out episode. After that episode the patient was brought here for further workup. Patient was found to be having significantly elevated carboxyhemoglobin level. The patient was placed on nonrebreather mask despite which he continues to have mild elevation of serum CO2 and therefore he was recommended to be admitted in the hospital for further workup and continuation of 100% non-rebreather mask. At the time of my evaluation the patient is stable and does not have any acute complaint. I will recheck his CO2 level at 2 AM. He continues to remain elevated he will require further treatment.  2.Lactic acidosis Patient was found to be having elevated levels of lactic acid level. Also had some syncope and call us. This is all consistent with COPD poisoning. At the time of my evaluation all this symptoms have resolved and therefore he appears to be having a mildly low CO2 poisoning. We will continue was monitoring.  3  Syncope and collapse Monitor on telemetry. And no further workup needed as the possible causes CO poisoning  Nutrition: Regular diet DVT Prophylaxis: subcutaneous Heparin  Advance goals of care discussion: Full code    Disposition: Admitted as observation telemetry unit.  Author: Berle Mull, MD [facility] Pager: [phone] 11/05/2014  If 7PM-7AM, please contact night-coverage [email or web address] Password [email or web address]

## 2014-11-05 NOTE — ED Notes (Signed)
Pt was operating a concrete saw ran by propane inside of a building and became very dizzy and diaphoretic. Pt stopped saw and tried to walk outside and per bystanders at a syncope epsiode that lasted approximately 2 minutes. Pt has had five pisodes of vomitting is still nauseous at this time. Denies any headache or blurry vision. Denies any pain.

## 2014-11-05 NOTE — Progress Notes (Signed)
Critical COHb reported to RN.

## 2014-11-06 ENCOUNTER — Observation Stay (HOSPITAL_COMMUNITY): Payer: PPO

## 2014-11-06 DIAGNOSIS — T5894XA Toxic effect of carbon monoxide from unspecified source, undetermined, initial encounter: Secondary | ICD-10-CM | POA: Diagnosis not present

## 2014-11-06 DIAGNOSIS — E872 Acidosis: Secondary | ICD-10-CM | POA: Diagnosis not present

## 2014-11-06 DIAGNOSIS — N183 Chronic kidney disease, stage 3 (moderate): Secondary | ICD-10-CM | POA: Diagnosis not present

## 2014-11-06 DIAGNOSIS — R55 Syncope and collapse: Secondary | ICD-10-CM | POA: Diagnosis not present

## 2014-11-06 LAB — COMPREHENSIVE METABOLIC PANEL
ALK PHOS: 39 U/L (ref 38–126)
ALT: 12 U/L — ABNORMAL LOW (ref 17–63)
ANION GAP: 8 (ref 5–15)
AST: 28 U/L (ref 15–41)
Albumin: 3.2 g/dL — ABNORMAL LOW (ref 3.5–5.0)
BILIRUBIN TOTAL: 1.7 mg/dL — AB (ref 0.3–1.2)
BUN: 14 mg/dL (ref 6–20)
CALCIUM: 8.4 mg/dL — AB (ref 8.9–10.3)
CO2: 21 mmol/L — AB (ref 22–32)
Chloride: 111 mmol/L (ref 101–111)
Creatinine, Ser: 1.39 mg/dL — ABNORMAL HIGH (ref 0.61–1.24)
GFR calc non Af Amer: 51 mL/min — ABNORMAL LOW (ref >60)
GFR, EST AFRICAN AMERICAN: 59 mL/min — AB (ref >60)
Glucose, Bld: 95 mg/dL (ref 65–99)
Potassium: 4 mmol/L (ref 3.5–5.1)
SODIUM: 140 mmol/L (ref 135–145)
TOTAL PROTEIN: 6.1 g/dL — AB (ref 6.5–8.1)

## 2014-11-06 LAB — CARBOXYHEMOGLOBIN
Carboxyhemoglobin: 0.7 % (ref 0.5–1.5)
METHEMOGLOBIN: 1 % (ref 0.0–1.5)
O2 SAT: 55 %
TOTAL HEMOGLOBIN: 11.2 g/dL — AB (ref 13.5–18.0)

## 2014-11-06 LAB — TROPONIN I

## 2014-11-06 LAB — CBC
HCT: 31.4 % — ABNORMAL LOW (ref 39.0–52.0)
HEMOGLOBIN: 10.1 g/dL — AB (ref 13.0–17.0)
MCH: 28.7 pg (ref 26.0–34.0)
MCHC: 32.2 g/dL (ref 30.0–36.0)
MCV: 89.2 fL (ref 78.0–100.0)
PLATELETS: 157 10*3/uL (ref 150–400)
RBC: 3.52 MIL/uL — AB (ref 4.22–5.81)
RDW: 15.9 % — ABNORMAL HIGH (ref 11.5–15.5)
WBC: 7.3 10*3/uL (ref 4.0–10.5)

## 2014-11-06 LAB — GLUCOSE, CAPILLARY: Glucose-Capillary: 114 mg/dL — ABNORMAL HIGH (ref 65–99)

## 2014-11-06 NOTE — Discharge Summary (Signed)
Physician Discharge Summary  Dan Mathis OVF:[id number] DOB: 06-24-1950 DOA: 11/05/2014  PCP: No PCP Per Patient  Admit date: 11/05/2014 Discharge date: 11/06/2014  Time spent: 40 minutes  Recommendations for Outpatient Follow-up:  1. Follow-up with primary care physician within one week.  Discharge Diagnoses:  Principal Problem:   Carbon monoxide poisoning Active Problems:   CKD (chronic kidney disease) stage 3, GFR 30-59 ml/min   Lactic acidosis   Syncope and collapse   Discharge Condition: Stable  Diet recommendation: Heart healthy  Filed Weights   11/05/14 1648  Weight: 81.647 kg (180 lb)    History of present illness:  Dan Mathis is a 64 y.o. male with no significant Past medical history. The patient is presenting with complains of a syncopal event. The patient mentions that he was working on a saw which is propane driven inside of building. He felt dizzy and lightheaded as well as having some sweating sensations. He went outside to get some fresh air. He felt significantly weak and passed out. There were by 6 cm thought that he was passed out for more than 2 minutes. Patient denies completely losing consciousness. Patient denies having any head injury or neck injury. At the time of my evaluation he denies any complaints of chest pain and abdominal pain nausea vomiting diarrhea headache focal deficit burning urination constipation. He does not take any medications at his baseline. He denies any alcohol drinking or drug abuse.  The patient is coming from home.  At his baseline ambulates without support And is independent for most of his ADL; manages his medication on his own.  Hospital Course:   Carbon monoxide poisoning The patient is presenting with complaints of a passing out episode. After that episode the patient was brought here for further workup. Patient was found to be having significantly elevated carboxyhemoglobin level of 13.8. Patient placed on  100% oxygen through nonrebreather mask. Carboxyhemoglobin is back to normal was 0.7, patient denies any complaints, discharged home. Patient instructed to follow-up with PCP, as some CO2 poisoning patients will have delayed sequela.  Lactic acidosis Patient was found to be having elevated levels of lactic acid level. Likely lactic acidosis from CO poisoning and hypoxia, this is resolved. On admission lactic acid was 3.8, lactic acid is 1.0 now.  Syncope and collapse As mentioned above patient never lost his consciousness completely. His weakness and presyncope is likely secondary to the CO poisoning. No further workup done.  Low-grade fever Patient has low-grade fever of 99.5, chest x-ray done showed no evidence of acute pulmonary findings. Patient denies any symptoms or signs of infections.? Could be reactive. Instructed to follow-up with PCP or come back to the ED if he develops high-grade fever.  Elevated creatinine Unclear if patient has CKD stage III or this is AKI. But likely he has CKD III. There is BMP from 2010 with creatinine of 1.4, patient now is 1.39 around that baseline.   Procedures:  None  Consultations:  None  Discharge Exam: Filed Vitals:   11/06/14 0510  BP: 100/62  Pulse: 76  Temp: 99.5 F (37.5 C)  Resp: 18   General: Alert and awake, oriented x3, not in any acute distress. HEENT: anicteric sclera, pupils reactive to light and accommodation, EOMI CVS: S1-S2 clear, no murmur rubs or gallops Chest: clear to auscultation bilaterally, no wheezing, rales or rhonchi Abdomen: soft nontender, nondistended, normal bowel sounds, no organomegaly Extremities: no cyanosis, clubbing or edema noted bilaterally Neuro: Cranial nerves II-XII intact, no focal neurological  deficits  Discharge Instructions   Discharge Instructions    Diet - low sodium heart healthy    Complete by:  As directed      Increase activity slowly    Complete by:  As directed            There are no discharge medications for this patient.  No Known Allergies    The results of significant diagnostics from this hospitalization (including imaging, microbiology, ancillary and laboratory) are listed below for reference.    Significant Diagnostic Studies: Dg Chest 2 View  11/06/2014   CLINICAL DATA:  Syncope and collapse after an handling pro pain. Nausea, vomiting and shortness of breath.  EXAM: CHEST  2 VIEW  COMPARISON:  None.  FINDINGS: Normal heart size. There is no pleural effusion identified. Opacity in the left base is identified, favored to represent atelectasis. Right lung is clear.  IMPRESSION: 1. Left base opacity, likely atelectasis.   Electronically Signed   By: Kerby Moors M.D.   On: 11/06/2014 08:36   Ct Head Wo Contrast  11/05/2014   CLINICAL DATA:  Syncope. Acute onset of dizziness and diaphoresis prior to the syncopal event. Subsequent vomiting and nausea.  EXAM: CT HEAD WITHOUT CONTRAST  TECHNIQUE: Contiguous axial images were obtained from the base of the skull through the vertex without intravenous contrast.  COMPARISON:  None.  FINDINGS: No intracranial hemorrhage, mass effect, or midline shift. No hydrocephalus. The basilar cisterns are patent. No evidence of territorial infarct. No intracranial fluid collection. Calvarium is intact. Included paranasal sinuses and mastoid air cells are well aerated.  IMPRESSION: No acute intracranial abnormality.   Electronically Signed   By: Jeb Levering M.D.   On: 11/05/2014 19:15    Microbiology: No results found for this or any previous visit (from the past 240 hour(s)).   Labs: Basic Metabolic Panel:  Recent Labs Lab 11/05/14 1718 11/05/14 2211 11/06/14 0656  NA 140 139 140  K 3.7 4.8 4.0  CL 108 110 111  CO2 19* 21* 21*  GLUCOSE 130* 98 95  BUN 16 16 14   CREATININE 1.55* 1.38* 1.39*  CALCIUM 9.3 8.8* 8.4*   Liver Function Tests:  Recent Labs Lab 11/06/14 0656  AST 28  ALT 12*  ALKPHOS 39   BILITOT 1.7*  PROT 6.1*  ALBUMIN 3.2*   No results for input(s): LIPASE, AMYLASE in the last 168 hours. No results for input(s): AMMONIA in the last 168 hours. CBC:  Recent Labs Lab 11/05/14 1718 11/06/14 0656  WBC 6.4 7.3  NEUTROABS 4.6  --   HGB 11.9* 10.1*  HCT 35.0* 31.4*  MCV 88.2 89.2  PLT 163 157   Cardiac Enzymes:  Recent Labs Lab 11/06/14 0656  TROPONINI <0.03   BNP: BNP (last 3 results) No results for input(s): BNP in the last 8760 hours.  ProBNP (last 3 results) No results for input(s): PROBNP in the last 8760 hours.  CBG: No results for input(s): GLUCAP in the last 168 hours.     Signed:  Dannis Deroche A  [facility] 11/06/2014, 10:56 AM

## 2014-11-06 NOTE — Care Management Note (Signed)
Case Management Note  Patient Details  Name: Dan Mathis MRN: [id number] Date of Birth: [birth year]  Subjective/Objective:         Date: 11/06/14 Spoke with patient at the bedside . Introduced self as Tourist information centre manager and explained role in discharge planning and how to be reached. Verified patient lives in town, alone with his father. Expressed no potential need for no other DME. Verified patient anticipates to go home with family, at time of discharge and will have part-time supervision by family  at this time to best of their knowledge. Patient confirmed may  need help with their medication. Patient drives  to MD appointments. Verified patient [facility], but will go to [facility] for overflow follow f/u.  Marland Kitchen   Plan: CM will continue to follow for discharge planning and [facility] resources.            Action/Plan:   Expected Discharge Date:                  Expected Discharge Plan:  Home/Self Care  In-House Referral:     Discharge planning Services  CM Consult  Post Acute Care Choice:    Choice offered to:     DME Arranged:    DME Agency:     HH Arranged:    [place] Agency:     Status of Service:  Completed, signed off  Medicare Important Message Given:    Date Medicare IM Given:    Medicare IM give by:    Date Additional Medicare IM Given:    Additional Medicare Important Message give by:     If discussed at [place] of Stay Meetings, dates discussed:    Additional Comments:  Zenon Mayo, RN 11/06/2014, 1:57 PM

## 2014-12-03 ENCOUNTER — Ambulatory Visit: Payer: PPO | Admitting: Family Medicine

## 2014-12-28 ENCOUNTER — Ambulatory Visit (INDEPENDENT_AMBULATORY_CARE_PROVIDER_SITE_OTHER): Payer: PPO | Admitting: Family Medicine

## 2014-12-28 ENCOUNTER — Encounter: Payer: Self-pay | Admitting: Family Medicine

## 2014-12-28 ENCOUNTER — Encounter: Payer: Self-pay | Admitting: Gastroenterology

## 2014-12-28 VITALS — BP 124/83 | HR 69 | Temp 97.9°F | Ht 68.0 in | Wt 181.0 lb

## 2014-12-28 DIAGNOSIS — Z0000 Encounter for general adult medical examination without abnormal findings: Secondary | ICD-10-CM

## 2014-12-28 DIAGNOSIS — Z7189 Other specified counseling: Secondary | ICD-10-CM

## 2014-12-28 DIAGNOSIS — Z7689 Persons encountering health services in other specified circumstances: Secondary | ICD-10-CM

## 2014-12-28 LAB — COMPLETE METABOLIC PANEL WITH GFR
ALBUMIN: 4.3 g/dL (ref 3.6–5.1)
ALT: 12 U/L (ref 9–46)
AST: 22 U/L (ref 10–35)
Alkaline Phosphatase: 43 U/L (ref 40–115)
BUN: 14 mg/dL (ref 7–25)
CALCIUM: 9.7 mg/dL (ref 8.6–10.3)
CHLORIDE: 105 mmol/L (ref 98–110)
CO2: 25 mmol/L (ref 20–31)
CREATININE: 1.02 mg/dL (ref 0.70–1.25)
GFR, Est African American: 88 mL/min (ref >=60)
GFR, Est Non African American: 76 mL/min (ref >=60)
GLUCOSE: 89 mg/dL (ref 65–99)
POTASSIUM: 4.6 mmol/L (ref 3.5–5.3)
SODIUM: 141 mmol/L (ref 135–146)
Total Bilirubin: 0.8 mg/dL (ref 0.2–1.2)
Total Protein: 8.1 g/dL (ref 6.1–8.1)

## 2014-12-28 LAB — CBC WITH DIFFERENTIAL/PLATELET
Basophils Absolute: 0 10*3/uL (ref 0.0–0.1)
Basophils Relative: 1 % (ref 0–1)
EOS PCT: 3 % (ref 0–5)
Eosinophils Absolute: 0.1 10*3/uL (ref 0.0–0.7)
HEMATOCRIT: 38.7 % — AB (ref 39.0–52.0)
HEMOGLOBIN: 13.1 g/dL (ref 13.0–17.0)
LYMPHS ABS: 1.4 10*3/uL (ref 0.7–4.0)
LYMPHS PCT: 47 % — AB (ref 12–46)
MCH: 29.7 pg (ref 26.0–34.0)
MCHC: 33.9 g/dL (ref 30.0–36.0)
MCV: 87.8 fL (ref 78.0–100.0)
MONO ABS: 0.3 10*3/uL (ref 0.1–1.0)
MONOS PCT: 9 % (ref 3–12)
MPV: 10.7 fL (ref 8.6–12.4)
NEUTROS ABS: 1.2 10*3/uL — AB (ref 1.7–7.7)
Neutrophils Relative %: 40 % — ABNORMAL LOW (ref 43–77)
Platelets: 201 10*3/uL (ref 150–400)
RBC: 4.41 MIL/uL (ref 4.22–5.81)
RDW: 16 % — AB (ref 11.5–15.5)
WBC: 3 10*3/uL — AB (ref 4.0–10.5)

## 2014-12-28 LAB — LIPID PANEL
Cholesterol: 230 mg/dL — ABNORMAL HIGH (ref 125–200)
HDL: 46 mg/dL (ref >=40)
LDL CALC: 165 mg/dL — AB (ref <130)
TRIGLYCERIDES: 93 mg/dL (ref <150)
Total CHOL/HDL Ratio: 5 Ratio (ref <=5.0)
VLDL: 19 mg/dL (ref <30)

## 2014-12-28 NOTE — Progress Notes (Signed)
Patient ID: Dan Mathis, male   DOB: 09-29-1950, 64 y.o.   MRN: [id number]   Dan Mathis, is a 64 y.o. male  [id number]  [id number]  DOB - 01/07/1951  CC:  Chief Complaint  Patient presents with  . new patient/problem    1 month ago while patient was at work using a propane saw patient got dizzy and fell out, no episodes since       HPI: Dan Mathis is a 64 y.o. male here to establish care. He reports being in good health. He has not had a primary care provider in sometime. He was seen in ED about a months ago with carbon monoxide poisoning from faulty equipment he was using. His labs at that time showed several abnormalities. It is unclear to me as to which may have been related to carbon monoxide poisoning. He denies tobacco, alcohol and drug use. He declines influenza and pneumonia vaccines today. He wishes to do some research first. He also declines Zostavax at this time.  He is on no chronic medications. He has a family history of prostate cancer in father.   No Known Allergies Past Medical History  Diagnosis Date  . Carbon monoxide poisoning 11/05/2014    using a saw which is driven by propane inside the building   No current outpatient prescriptions on file prior to visit.   No current facility-administered medications on file prior to visit.   No family history on file. Social History   Social History  . Marital Status: Single    Spouse Name: N/A  . Number of Children: N/A  . Years of Education: N/A   Occupational History  . Not on file.   Social History Main Topics  . Smoking status: Never Smoker   . Smokeless tobacco: Never Used  . Alcohol Use: No  . Drug Use: No  . Sexual Activity: Yes   Other Topics Concern  . Not on file   Social History Narrative    Review of Systems: Constitutional: Negative for fever, chills, appetite change, weight loss,  Fatigue. Skin: Negative for rashes or lesions of concern. HENT: Negative for ear pain, ear  discharge.nose bleeds Eyes: Negative for pain, discharge, redness, itching and visual disturbance. Neck: Negative for pain, stiffness Respiratory: Negative for cough, shortness of breath,   Cardiovascular: Negative for chest pain, palpitations and leg swelling. Gastrointestinal: Negative for abdominal pain, nausea, vomiting, diarrhea, constipations Genitourinary: Negative for dysuria, urgency, frequency, hematuria, Positive for nocturia Musculoskeletal: Negative for back pain, joint  swelling, and gait problem  Positive for left knee pain.Negative for weakness. Neurological: Negative for dizziness, tremors, seizures, syncope,   light-headedness, numbness and headaches.  Hematological: Negative for easy bruising or bleeding Psychiatric/Behavioral: Negative for depression, anxiety, decreased concentration, confusion   Objective:   Filed Vitals:   12/28/14 0911  BP: 124/83  Pulse: 69  Temp: 97.9 F (36.6 C)    Physical Exam: Constitutional: Patient appears well-developed and well-nourished. No distress. HENT: Normocephalic, atraumatic, External right and left ear normal. Oropharynx is clear and moist. Teeth in poor repair, Has upper dentures. Eyes: Conjunctivae and EOM are normal. PERRLA, no scleral icterus.Sclera are muddy. Neck: Normal ROM. Neck supple. No lymphadenopathy, No thyromegaly. CVS: RRR, S1/S2 +, no murmurs, no gallops, no rubs Pulmonary: Effort and breath sounds normal, no stridor, rhonchi, wheezes, rales.  Abdominal: Soft. Normoactive BS,, no distension, tenderness, rebound or guarding.  Musculoskeletal: Normal range of motion. No edema and no tenderness.  Neuro: Alert.Normal muscle tone coordination. Non-focal  Skin: Skin is warm and dry. No rash noted. Not diaphoretic. No erythema. No pallor. Psychiatric: Normal mood and affect. Behavior, judgment, thought content normal.  Lab Results  Component Value Date   WBC 7.3 11/06/2014   HGB 10.1* 11/06/2014   HCT 31.4*  11/06/2014   MCV 89.2 11/06/2014   PLT 157 11/06/2014   Lab Results  Component Value Date   CREATININE 1.39* 11/06/2014   BUN 14 11/06/2014   NA 140 11/06/2014   K 4.0 11/06/2014   CL 111 11/06/2014   CO2 21* 11/06/2014    No results found for: HGBA1C Lipid Panel  No results found for: CHOL, TRIG, HDL, CHOLHDL, VLDL, LDLCALC     Assessment and plan:   1. Encounter to establish care  -I have reviewed information provided by the patient and pertient labs in chart. There are some abnormalities on last lab work so will repeat those.  2. Health care maintenance   - COMPLETE METABOLIC PANEL WITH GFR - CBC with Differential - Lipid panel - Vitamin D 1,25 dihydroxy - TSH - HIV antibody (with reflex) - PSA - Ambulatory referral to Gastroenterology  -I have asked him to research needed vaccines and advised to get the recommended vaccines for his age. -i have provided healthy lifestyle print out and encouraged him to read and follow recommendations.    Return in about 6 months (around 06/27/2015).  The patient was given clear instructions to go to ER or return to medical center if symptoms don't improve, worsen or new problems develop. The patient verbalized understanding.    Micheline Bartelt FNP  12/28/2014, 9:57 AM

## 2014-12-28 NOTE — Patient Instructions (Addendum)

## 2014-12-29 LAB — HIV ANTIBODY (ROUTINE TESTING W REFLEX): HIV 1&2 Ab, 4th Generation: NONREACTIVE

## 2014-12-29 LAB — TSH: TSH: 1.462 u[IU]/mL (ref 0.350–4.500)

## 2014-12-29 LAB — PSA: PSA: 13.21 ng/mL — AB (ref <=4.00)

## 2015-01-01 LAB — VITAMIN D 1,25 DIHYDROXY
VITAMIN D 1, 25 (OH) TOTAL: 57 pg/mL (ref 18–72)
VITAMIN D3 1, 25 (OH): 57 pg/mL

## 2015-01-03 ENCOUNTER — Other Ambulatory Visit: Payer: Self-pay | Admitting: Family Medicine

## 2015-01-03 DIAGNOSIS — D72819 Decreased white blood cell count, unspecified: Secondary | ICD-10-CM

## 2015-01-03 DIAGNOSIS — R972 Elevated prostate specific antigen [PSA]: Secondary | ICD-10-CM

## 2015-01-03 MED ORDER — PRAVASTATIN SODIUM 40 MG PO TABS: 40.0000 mg | ORAL_TABLET | Freq: Every day | ORAL | Status: AC

## 2015-02-12 ENCOUNTER — Ambulatory Visit (AMBULATORY_SURGERY_CENTER): Payer: Self-pay | Admitting: *Deleted

## 2015-02-12 VITALS — Ht 69.0 in | Wt 190.0 lb

## 2015-02-12 DIAGNOSIS — Z1211 Encounter for screening for malignant neoplasm of colon: Secondary | ICD-10-CM

## 2015-02-12 MED ORDER — NA SULFATE-K SULFATE-MG SULF 17.5-3.13-1.6 GM/177ML PO SOLN: 1.0000 | Freq: Once | ORAL | Status: DC

## 2015-02-12 NOTE — Progress Notes (Signed)
No egg or soy allergy known to patient  No issues with past sedation with any surgeries  or procedures, no intubation problems -post op N/V with finger surgery and had to stay 1 night in hospital No diet pills No home 02 use per patient   emmi video declined

## 2015-02-23 ENCOUNTER — Telehealth: Payer: Self-pay | Admitting: Gastroenterology

## 2015-02-23 NOTE — Telephone Encounter (Signed)
Yes charge 

## 2015-02-25 ENCOUNTER — Encounter: Payer: PPO | Admitting: Gastroenterology

## 2015-04-07 DIAGNOSIS — K648 Other hemorrhoids: Secondary | ICD-10-CM

## 2015-04-07 HISTORY — PX: COLONOSCOPY W/ POLYPECTOMY: SHX1380

## 2015-04-07 HISTORY — DX: Other hemorrhoids: K64.8

## 2015-05-04 ENCOUNTER — Ambulatory Visit (AMBULATORY_SURGERY_CENTER): Payer: PPO | Admitting: Gastroenterology

## 2015-05-04 ENCOUNTER — Encounter: Payer: Self-pay | Admitting: Gastroenterology

## 2015-05-04 VITALS — BP 108/77 | HR 77 | Temp 98.6°F | Resp 9 | Ht 69.0 in | Wt 190.0 lb

## 2015-05-04 DIAGNOSIS — Z1211 Encounter for screening for malignant neoplasm of colon: Secondary | ICD-10-CM | POA: Diagnosis not present

## 2015-05-04 DIAGNOSIS — D123 Benign neoplasm of transverse colon: Secondary | ICD-10-CM

## 2015-05-04 DIAGNOSIS — D125 Benign neoplasm of sigmoid colon: Secondary | ICD-10-CM | POA: Diagnosis not present

## 2015-05-04 DIAGNOSIS — N183 Chronic kidney disease, stage 3 (moderate): Secondary | ICD-10-CM | POA: Diagnosis not present

## 2015-05-04 MED ORDER — SODIUM CHLORIDE 0.9 % IV SOLN: 500.0000 mL | INTRAVENOUS | Status: DC

## 2015-05-04 NOTE — Op Note (Signed)
[place] Patient Name: Dan Mathis Procedure Date: 05/04/2015 7:43 AM MRN: [id number] Endoscopist: Ladene Artist , MD Age: 65 Referring MD:  Date of Birth: Jun 18, 1950 Gender: Male Procedure:                Colonoscopy Indications:              Screening for colorectal malignant neoplasm Medicines:                Monitored Anesthesia Care Procedure:                Pre-Anesthesia Assessment:                           - Prior to the procedure, a History and Physical                            was performed, and patient medications and                            allergies were reviewed. The patient's tolerance of                            previous anesthesia was also reviewed. The risks                            and benefits of the procedure and the sedation                            options and risks were discussed with the patient.                            All questions were answered, and informed consent                            was obtained. Prior Anticoagulants: The patient has                            taken no previous anticoagulant or antiplatelet                            agents. ASA Grade Assessment: II - A patient with                            mild systemic disease. After reviewing the risks                            and benefits, the patient was deemed in                            satisfactory condition to undergo the procedure.                           After obtaining informed consent, the colonoscope  was passed under direct vision. Throughout the                            procedure, the patient's blood pressure, pulse, and                            oxygen saturations were monitored continuously. The                            Model PCF-H190L [id number]) scope was introduced                            through the anus and advanced to the the cecum,                            identified by appendiceal orifice and  ileocecal                            valve. The colonoscopy was performed without                            difficulty. The patient tolerated the procedure                            well. The quality of the bowel preparation was                            excellent. The ileocecal valve, appendiceal                            orifice, and rectum were photographed. The quality                            of the bowel preparation was excellent. The                            ileocecal valve, appendiceal orifice, and rectum                            were photographed. Scope In: 8:11:08 AM Scope Out: 8:24:57 AM Scope Withdrawal Time: 0 hours 12 minutes 8 seconds  Total Procedure Duration: 0 hours 13 minutes 49 seconds  Findings:      The digital rectal exam was normal.      Two sessile polyps were found in the transverse colon. The polyps were 4       to 5 mm in size. These polyps were removed with a cold biopsy forceps.       Resection and retrieval were complete.      A 8 mm polyp was found in the sigmoid colon. The polyp was sessile. The       polyp was removed with a cold snare. Resection and retrieval were       complete.      Internal hemorrhoids were found during retroflexion. The hemorrhoids       were medium-sized and Grade I (internal hemorrhoids that  do not       prolapse).      The exam was otherwise without abnormality on direct and retroflexion       views. Complications:            No immediate complications. Estimated Blood Loss:     Estimated blood loss was minimal. Impression:               - Two 4 to 5 mm polyps in the transverse colon,                            removed with a cold biopsy forceps. Resected and                            retrieved.                           - One 8 mm polyp in the sigmoid colon, removed with                            a cold snare. Resected and retrieved.                           - Internal hemorrhoids.                           -  The examination was otherwise normal on direct                            and retroflexion views. Recommendation:           - Patient has a contact number available for                            emergencies. The signs and symptoms of potential                            delayed complications were discussed with the                            patient. Return to normal activities tomorrow.                            Written discharge instructions were provided to the                            patient.                           - Resume previous diet.                           - Continue present medications.                           - Await pathology results.                           -  Repeat colonoscopy in 5 years if polyps                            adenomatous otherwise 10 years. Procedure Code(s):        --- Professional ---                           [phone], Colonoscopy, flexible; with removal of                            tumor(s), polyp(s), or other lesion(s) by snare                            technique                           [id number], 82, Colonoscopy, flexible; with biopsy,                            single or multiple CPT copyright 2016 American Medical [employer]. All rights reserved. Ladene Artist, MD 05/04/2015 8:36:01 AM This report has been signed electronically. Number of Addenda: 0 Referring MD:      Micheline Welshans

## 2015-05-04 NOTE — Patient Instructions (Signed)
YOU HAD AN ENDOSCOPIC PROCEDURE TODAY AT THE [facility]:   Refer to the procedure report that was given to you for any specific questions about what was found during the examination.  If the procedure report does not answer your questions, please call your gastroenterologist to clarify.  If you requested that your care partner not be given the details of your procedure findings, then the procedure report has been included in a sealed envelope for you to review at your convenience later.  YOU SHOULD EXPECT: Some feelings of bloating in the abdomen. Passage of more gas than usual.  Walking can help get rid of the air that was put into your GI tract during the procedure and reduce the bloating. If you had a lower endoscopy (such as a colonoscopy or flexible sigmoidoscopy) you may notice spotting of blood in your stool or on the toilet paper. If you underwent a bowel prep for your procedure, you may not have a normal bowel movement for a few days.  Please Note:  You might notice some irritation and congestion in your nose or some drainage.  This is from the oxygen used during your procedure.  There is no need for concern and it should clear up in a day or so.  SYMPTOMS TO REPORT IMMEDIATELY:   Following lower endoscopy (colonoscopy or flexible sigmoidoscopy):  Excessive amounts of blood in the stool  Significant tenderness or worsening of abdominal pains  Swelling of the abdomen that is new, acute  Fever of 100F or higher  For urgent or emergent issues, a gastroenterologist can be reached at any hour by calling [phone].   DIET: Your first meal following the procedure should be a small meal and then it is ok to progress to your normal diet. Heavy or fried foods are harder to digest and may make you feel nauseous or bloated.  Likewise, meals heavy in dairy and vegetables can increase bloating.  Drink plenty of fluids but you should avoid alcoholic beverages for 24  hours.  ACTIVITY:  You should plan to take it easy for the rest of today and you should NOT DRIVE or use heavy machinery until tomorrow (because of the sedation medicines used during the test).    FOLLOW UP: Our staff will call the number listed on your records the next business day following your procedure to check on you and address any questions or concerns that you may have regarding the information given to you following your procedure. If we do not reach you, we will leave a message.  However, if you are feeling well and you are not experiencing any problems, there is no need to return our call.  We will assume that you have returned to your regular daily activities without incident.  If any biopsies were taken you will be contacted by phone or by letter within the next 1-3 weeks.  Please call us at [phone] if you have not heard about the biopsies in 3 weeks.    SIGNATURES/CONFIDENTIALITY: You and/or your care partner have signed paperwork which will be entered into your electronic medical record.  These signatures attest to the fact that that the information above on your After Visit Summary has been reviewed and is understood.  Full responsibility of the confidentiality of this discharge information lies with you and/or your care-partner.  Please review polyp and hemorrhoid handouts provided. Next colonoscopy determined by pathology results; 5 or 10 years. 

## 2015-05-04 NOTE — Progress Notes (Signed)
To PACU  Awake and Alert. Report to RN 

## 2015-05-04 NOTE — Progress Notes (Signed)
Called to room to assist during endoscopic procedure.  Patient ID and intended procedure confirmed with present staff. Received instructions for my participation in the procedure from the performing physician.  

## 2015-05-05 ENCOUNTER — Telehealth: Payer: Self-pay

## 2015-05-05 NOTE — Telephone Encounter (Signed)
  Follow up Call-  Call back number 05/04/2015  Post procedure Call Back phone  # [phone] cell  Permission to leave phone message Yes     Patient questions:  Do you have a fever, pain , or abdominal swelling? No. Pain Score  0 *  Have you tolerated food without any problems? Yes.    Have you been able to return to your normal activities? Yes.    Do you have any questions about your discharge instructions: Diet   No. Medications  No. Follow up visit  No.  Do you have questions or concerns about your Care? No.  Actions: * If pain score is 4 or above: No action needed, pain <4.

## 2015-05-14 ENCOUNTER — Encounter: Payer: Self-pay | Admitting: Gastroenterology

## 2015-06-28 ENCOUNTER — Ambulatory Visit: Payer: PPO | Admitting: Family Medicine

## 2015-07-09 ENCOUNTER — Ambulatory Visit: Payer: PPO | Admitting: Family Medicine

## 2016-10-18 IMAGING — CT CT HEAD W/O CM
2 series · 16 of 30 positions shown, 20 images · non-contrast
Comparison: None.

CLINICAL DATA: Syncope. Acute onset of dizziness and diaphoresis
prior to the syncopal event. Subsequent vomiting and nausea.

EXAM:
CT HEAD WITHOUT CONTRAST
TECHNIQUE: Contiguous axial images were obtained from the base of the skull
through the vertex without intravenous contrast.

[Series 201: head w/o, idose (1) · axial · non-contrast · 0.45mm/px · z∈[+86,+211]mm · 13 of 31 slices shown, 17 images]
[im 3/31  brain]
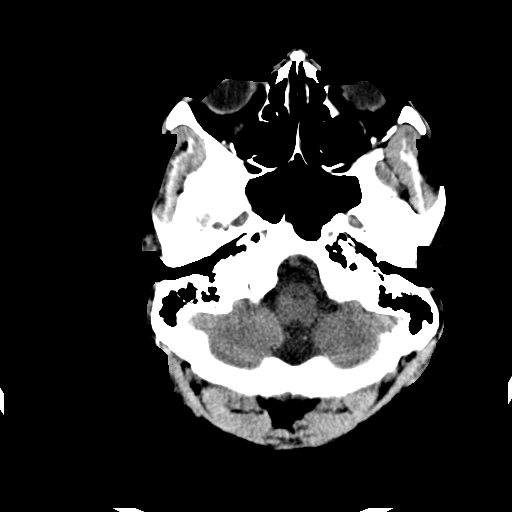
[im 3/31  bone]
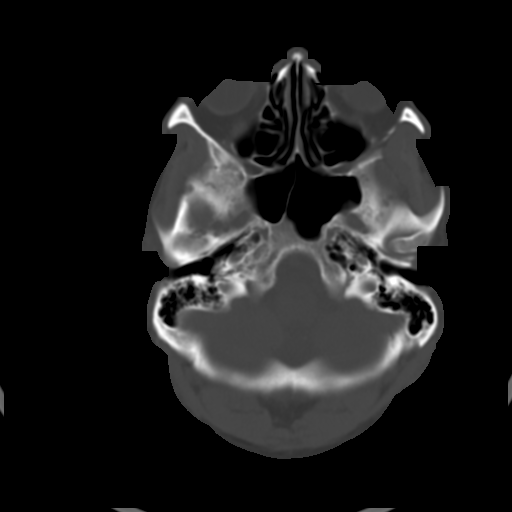
[im 5/31  brain]
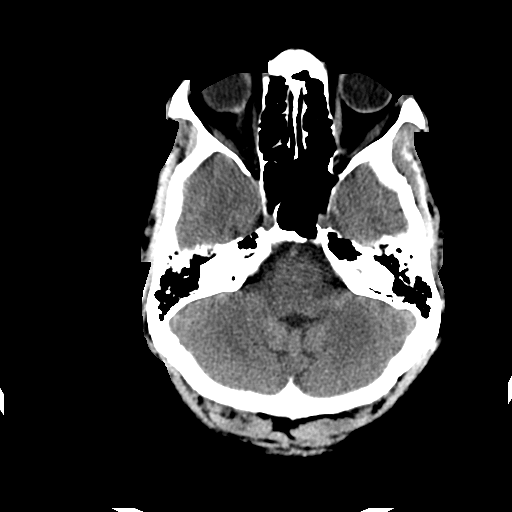
[im 7/31  brain]
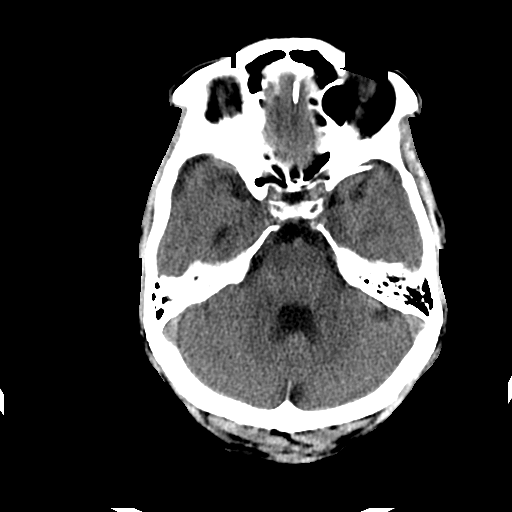
[im 9/31  brain]
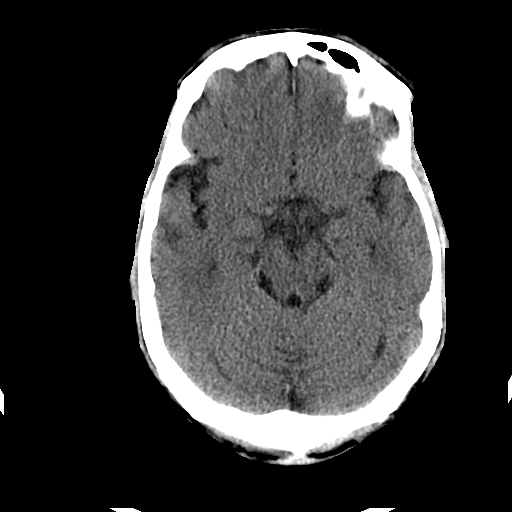
[im 11/31  brain]
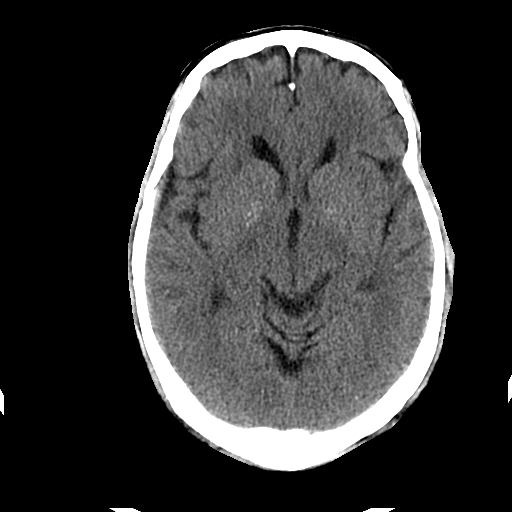
[im 11/31  bone]
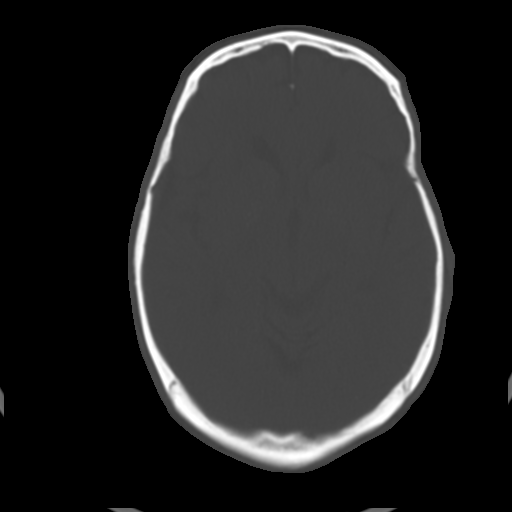
[im 13/31  brain]
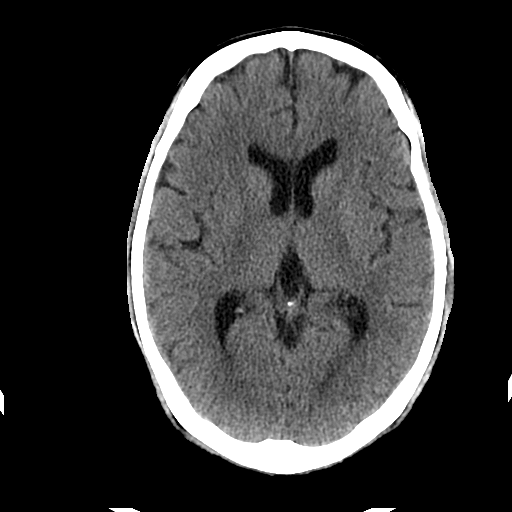
[im 16/31  brain]
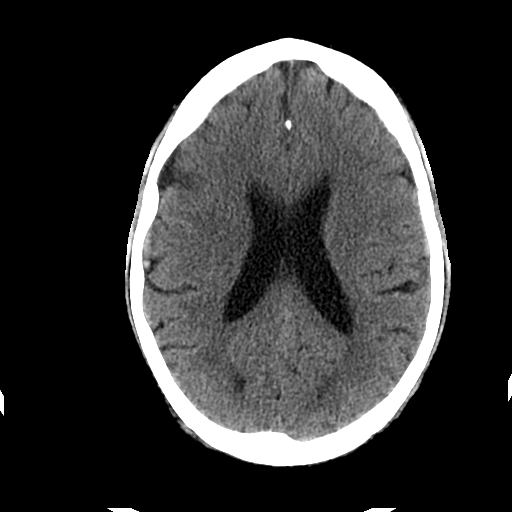
[im 18/31  brain]
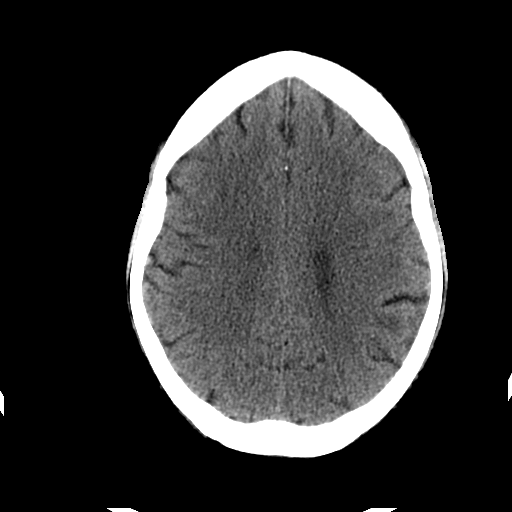
[im 20/31  brain]
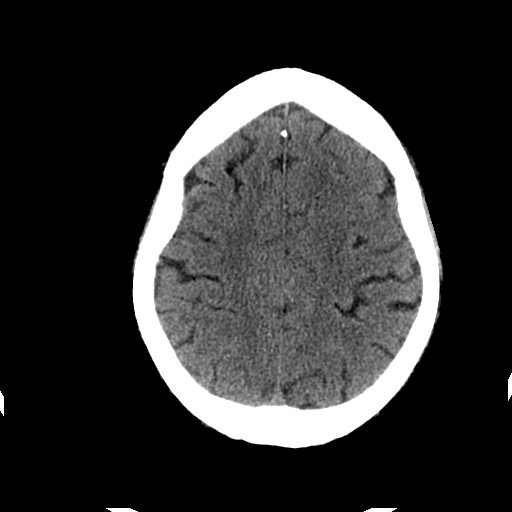
[im 20/31  bone]
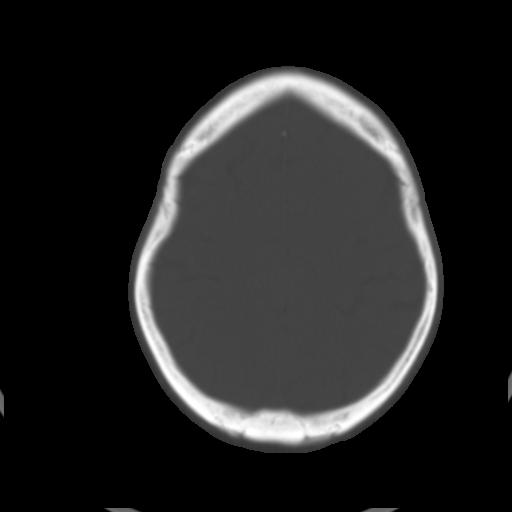
[im 22/31  brain]
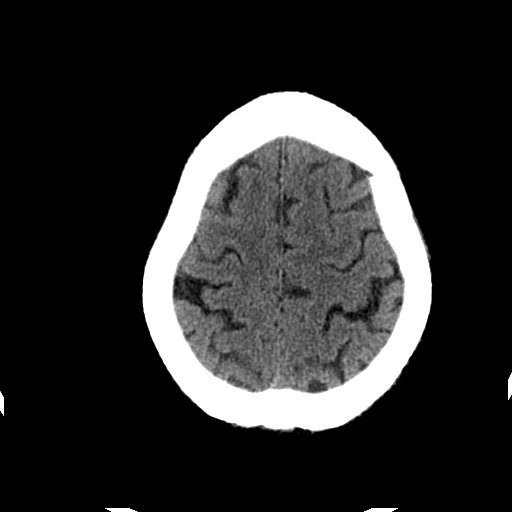
[im 24/31  brain]
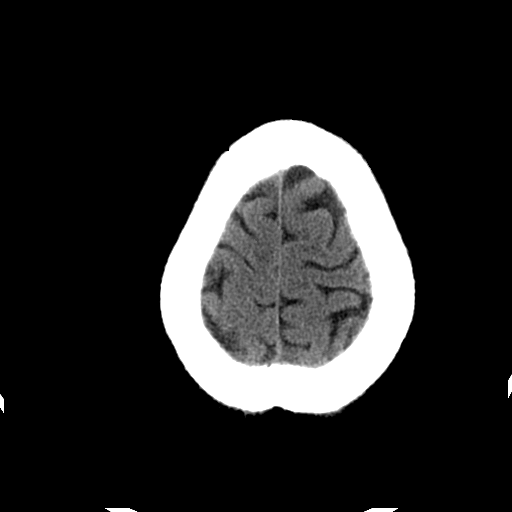
[im 26/31  brain]
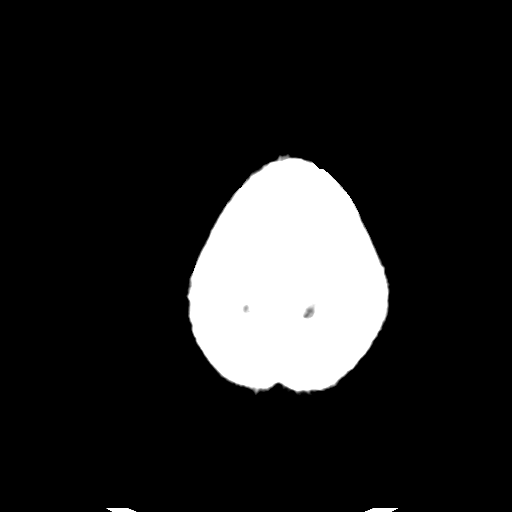
[im 28/31  brain]
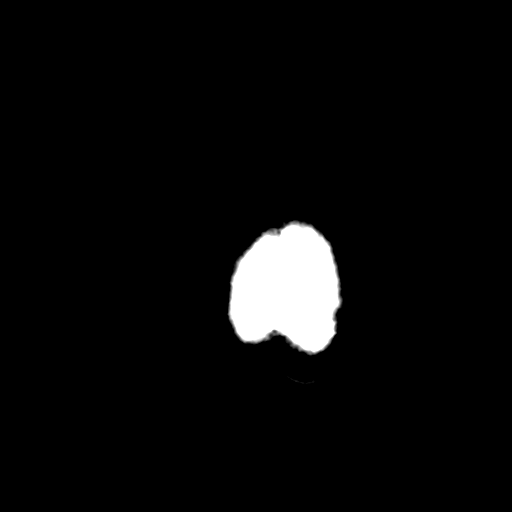
[im 28/31  bone]
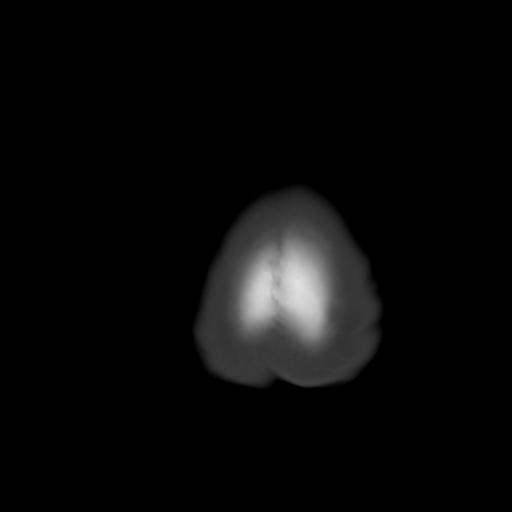

[Series 202: head w/o bone, idose (1) · axial · non-contrast · 0.45mm/px · z∈[+86,+126]mm · 3 of 31 slices shown]
[im 3/31  bone]
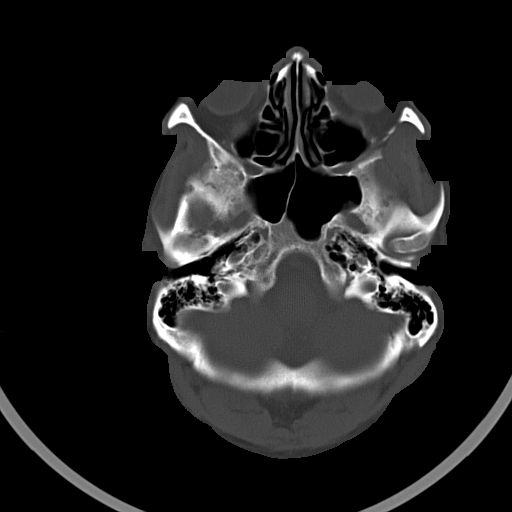
[im 7/31  bone]
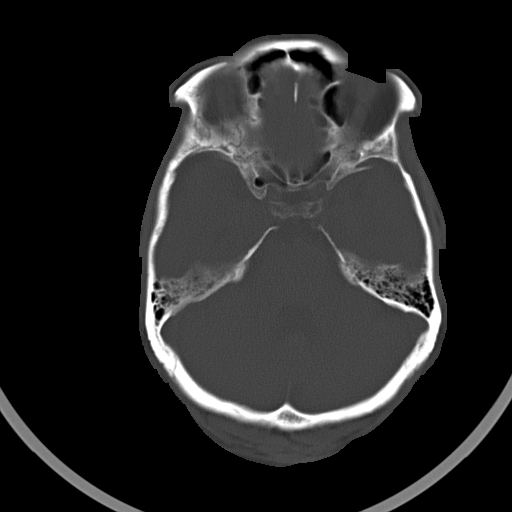
[im 11/31  bone]
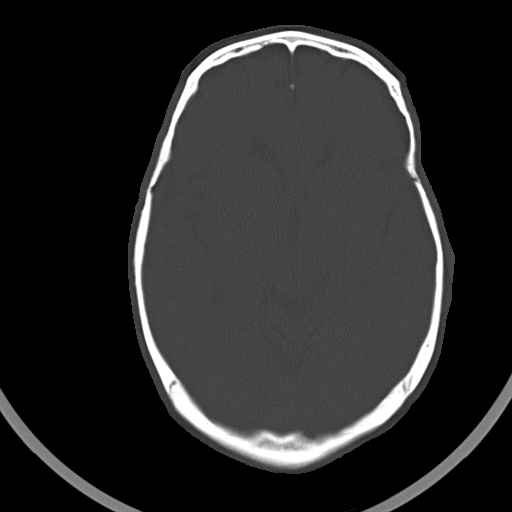

[16 of 30 positions shown; findings below may reference images not displayed]

FINDINGS: No intracranial hemorrhage, mass effect, or midline shift. No
hydrocephalus. The basilar cisterns are patent. No evidence of
territorial infarct. No intracranial fluid collection. Calvarium is
intact. Included paranasal sinuses and mastoid air cells are well
aerated.
IMPRESSION: No acute intracranial abnormality.

## 2016-10-19 IMAGING — CR DG CHEST 2V
2 series · 2 of 2 positions shown · non-contrast
Comparison: None.

CLINICAL DATA: Syncope and collapse after an handling pro pain.
Nausea, vomiting and shortness of breath.

EXAM:
CHEST  2 VIEW

[chest pa]
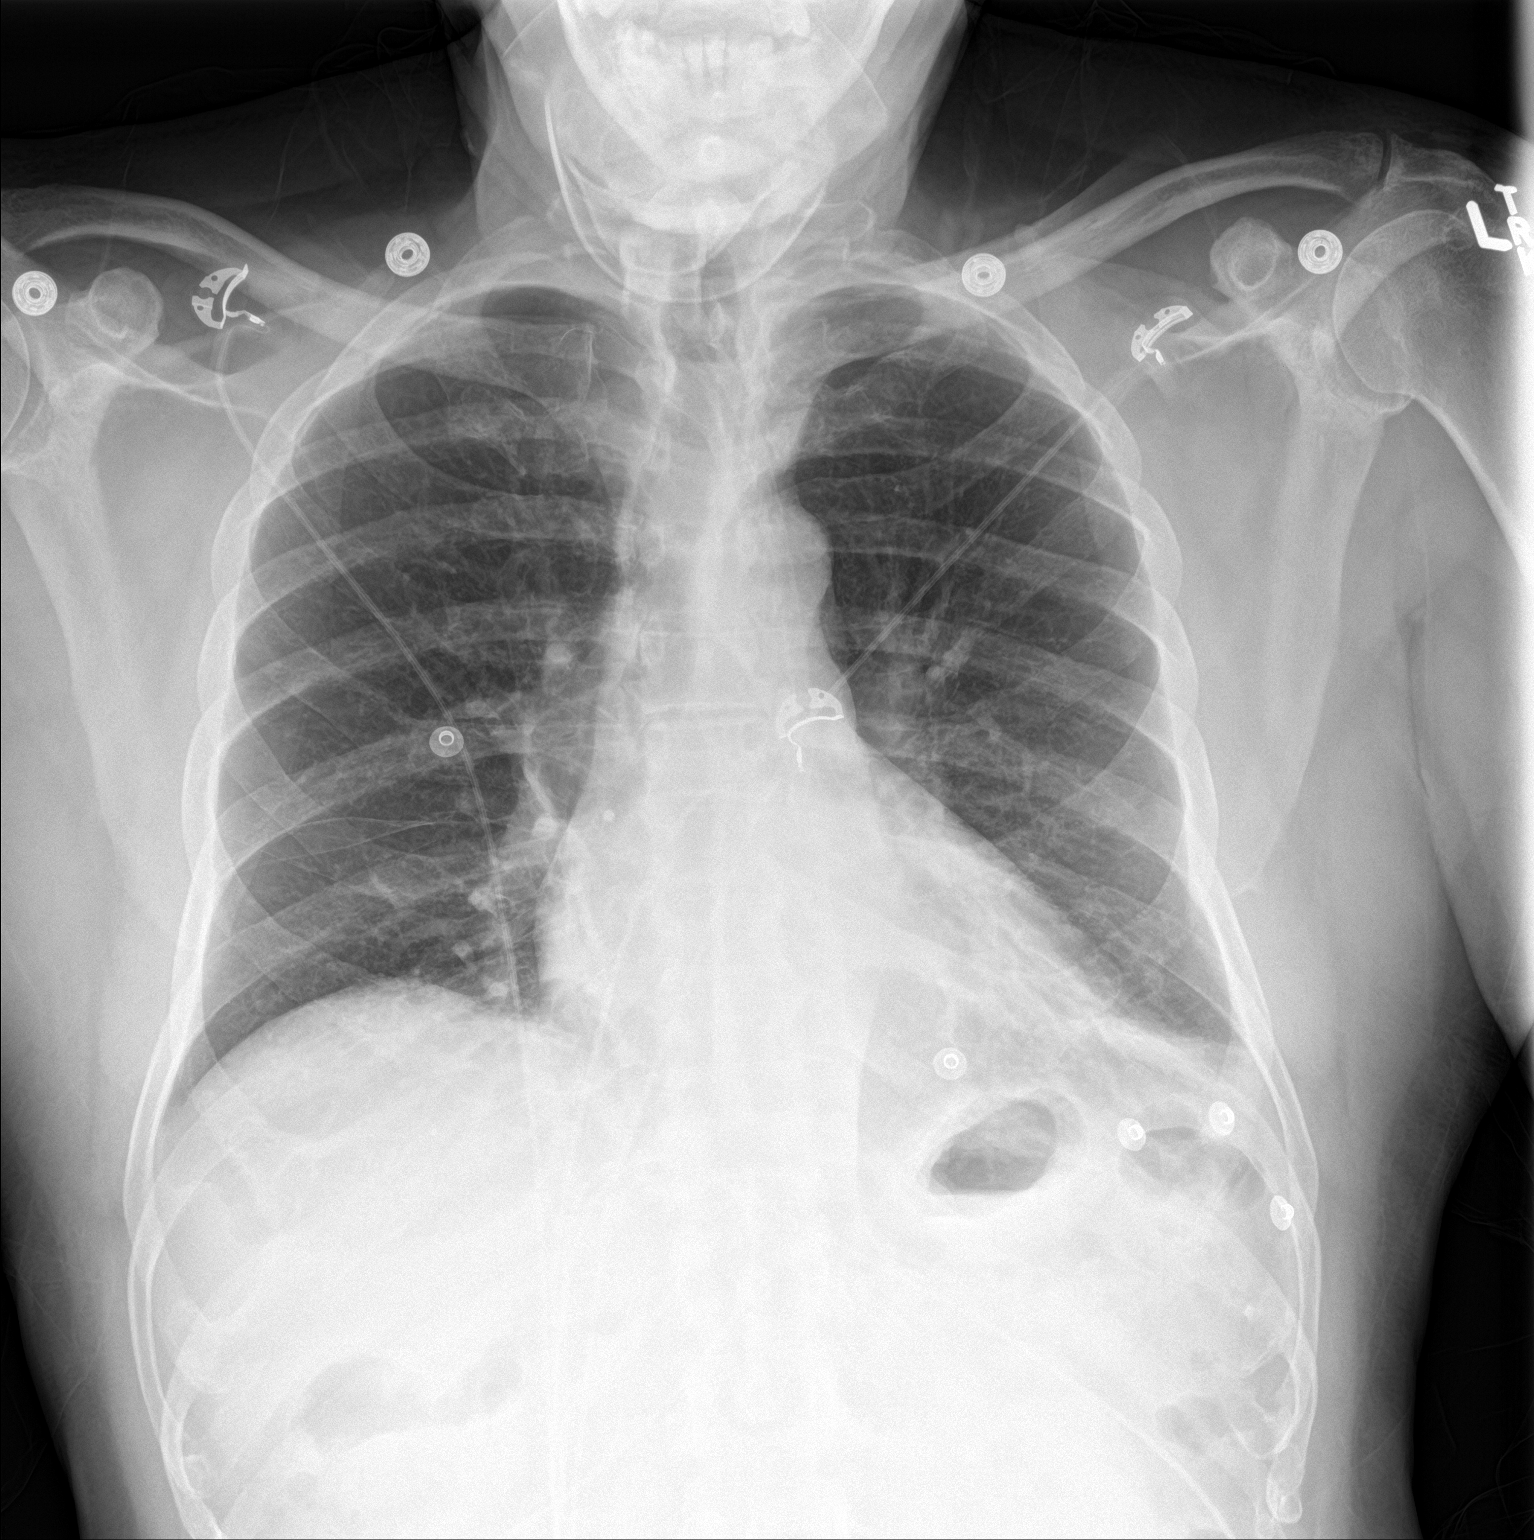

[chest lat]
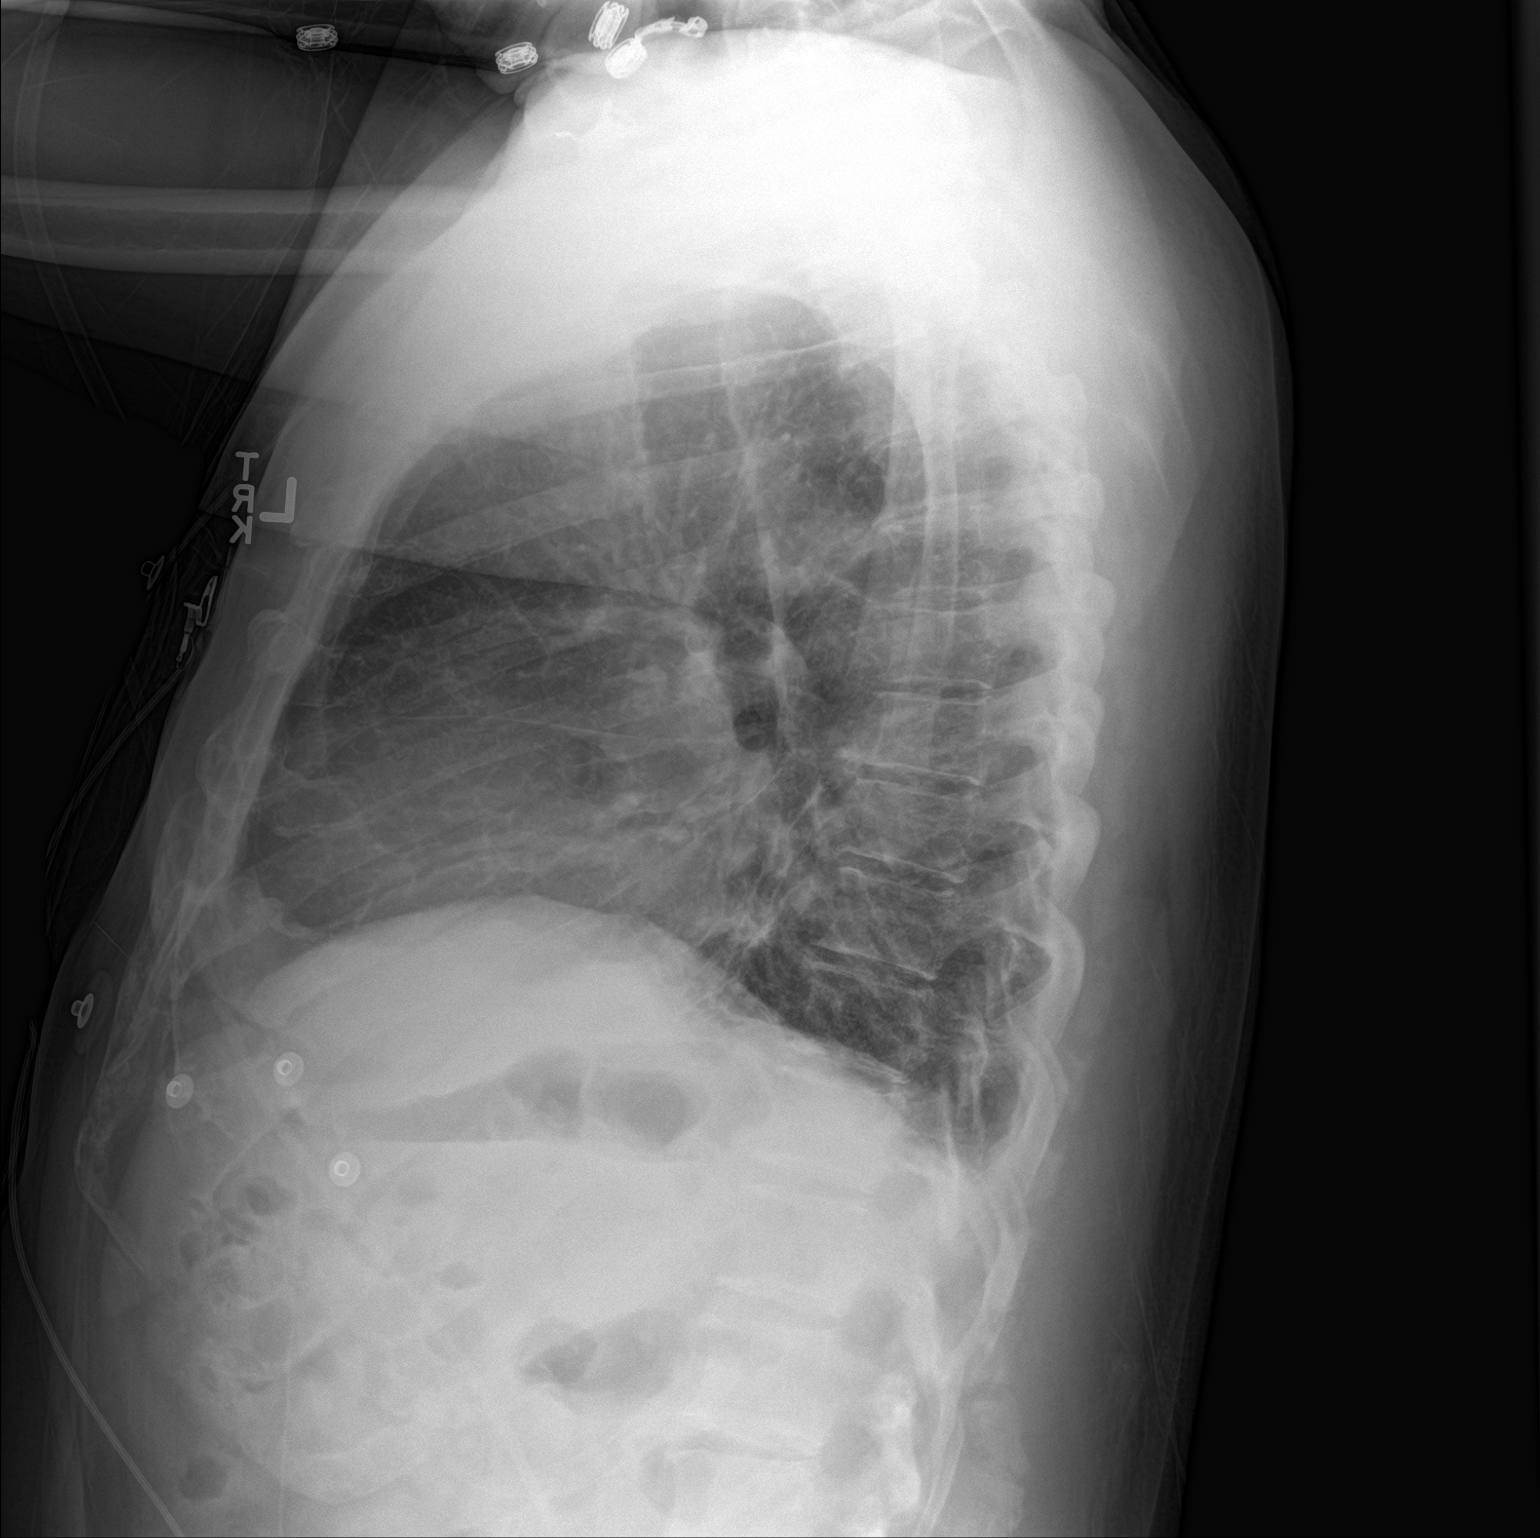

[2 of 2 positions shown; findings below may reference images not displayed]

FINDINGS: Normal heart size. There is no pleural effusion identified. Opacity
in the left base is identified, favored to represent atelectasis.
Right lung is clear.
IMPRESSION: 1. Left base opacity, likely atelectasis.

## 2016-12-07 ENCOUNTER — Emergency Department (HOSPITAL_COMMUNITY)
Admission: EM | Admit: 2016-12-07 | Discharge: 2016-12-08 | Disposition: A | Discharge status: Home / Self Care | Payer: PPO | Attending: Emergency Medicine | Admitting: Emergency Medicine

## 2016-12-07 ENCOUNTER — Encounter (HOSPITAL_COMMUNITY): Payer: Self-pay

## 2016-12-07 DIAGNOSIS — N183 Chronic kidney disease, stage 3 (moderate): Secondary | ICD-10-CM | POA: Insufficient documentation

## 2016-12-07 DIAGNOSIS — Z79899 Other long term (current) drug therapy: Secondary | ICD-10-CM | POA: Insufficient documentation

## 2016-12-07 DIAGNOSIS — R338 Other retention of urine: Secondary | ICD-10-CM | POA: Diagnosis not present

## 2016-12-07 DIAGNOSIS — R339 Retention of urine, unspecified: Secondary | ICD-10-CM | POA: Insufficient documentation

## 2016-12-07 DIAGNOSIS — N401 Enlarged prostate with lower urinary tract symptoms: Secondary | ICD-10-CM | POA: Diagnosis not present

## 2016-12-07 MED ORDER — LIDOCAINE HCL 2 % EX GEL
1.0000 "application " | Freq: Once | CUTANEOUS | Status: AC | PRN
  Administered 2016-12-08: 1 via URETHRAL
  Filled 2016-12-07 (×2): qty 11

## 2016-12-07 NOTE — ED Triage Notes (Signed)
Pt c/o acute urinary retention. He last voided this morning. Hx of same. 811 mL in bladder with bladder scan.

## 2016-12-07 NOTE — ED Notes (Signed)
Bed: TG25 Expected date:  Expected time:  Means of arrival:  Comments: TR 4

## 2016-12-07 NOTE — ED Provider Notes (Signed)
[facility] DEPT Provider Note   CSN: [id number] Arrival date & time: 12/07/16  2229     History   Chief Complaint Chief Complaint  Patient presents with  . Urinary Retention    HPI Dan Mathis is a 66 y.o. male.  The history is provided by the patient.  He has a history of chronic kidney disease, and states that he has been unable to urinate since about 8:30 AM.  At that time, he had not urinated very much at all.  He does have a history of urinary retention about 8 years ago, and did have prostate biopsies.  He denies fever or chills.  His only medication listed is atorvastatin.  Past Medical History:  Diagnosis Date  . Carbon monoxide poisoning 11/05/2014   using a saw which is driven by propane inside the building  . Hyperlipidemia     Patient Active Problem List   Diagnosis Date Noted  . CKD (chronic kidney disease) stage 3, GFR 30-59 ml/min (HCC) 11/05/2014  . Lactic acidosis 11/05/2014    Past Surgical History:  Procedure Laterality Date  . FINGER FRACTURE SURGERY Left    "pointer"  . FRACTURE SURGERY    . PROSTATE BIOPSY  2000's       Home Medications    Prior to Admission medications   Medication Sig Start Date End Date Taking? Authorizing Provider  pravastatin (PRAVACHOL) 40 MG tablet Take 1 tablet (40 mg total) by mouth daily. 01/03/15   Micheline Bundren, NP    Family History Family History  Problem Relation Age of Onset  . Colon cancer Neg Hx   . Esophageal cancer Neg Hx   . Rectal cancer Neg Hx   . Stomach cancer Neg Hx   . Pancreatic cancer Neg Hx   . Prostate cancer Father     Social History Social History  Substance Use Topics  . Smoking status: Never Smoker  . Smokeless tobacco: Never Used  . Alcohol use No     Allergies   Patient has no known allergies.   Review of Systems Review of Systems  All other systems reviewed and are negative.    Physical Exam Updated Vital Signs BP (!)  148/89 (BP Location: Left Arm)   Pulse 81   Temp 98.8 F (37.1 C) (Oral)   Resp 16   Ht 5\' 9"  (1.753 m)   Wt 81.6 kg (180 lb)   SpO2 100%   BMI 26.58 kg/m   Physical Exam  Nursing note and vitals reviewed.  66 year old male, resting comfortably and in no acute distress. Vital signs are significant for hypertension. Oxygen saturation is 100%, which is normal. Head is normocephalic and atraumatic. PERRLA, EOMI. Oropharynx is clear. Neck is nontender and supple without adenopathy or JVD. Back is nontender and there is no CVA tenderness. Lungs are clear without rales, wheezes, or rhonchi. Chest is nontender. Heart has regular rate and rhythm without murmur. Abdomen is significant for bladder distended to slightly above the umbilicus.  Remainder of abdomen is soft and nontender.  Peristalsis is normoactive. Genitalia: Uncircumcised penis. Extremities have no cyanosis or edema, full range of motion is present. Skin is warm and dry without rash. Neurologic: Mental status is normal, cranial nerves are intact, there are no motor or sensory deficits.  ED Treatments / Results  Labs (all labs ordered are listed, but only abnormal results are displayed) Labs Reviewed  URINALYSIS, ROUTINE W REFLEX MICROSCOPIC - Abnormal; Notable for the  following:       Result Value   Hgb urine dipstick LARGE (*)    Ketones, ur 20 (*)    Protein, ur 30 (*)    All other components within normal limits   Procedures Procedures (including critical care time)  Medications Ordered in ED Medications  lidocaine (XYLOCAINE) 2 % jelly 1 application (not administered)  cefTRIAXone (ROCEPHIN) injection 1 g (not administered)  LORazepam (ATIVAN) tablet 1 mg (1 mg Oral Given 12/08/16 0150)     Initial Impression / Assessment and Plan / ED Course  I have reviewed the triage vital signs and the nursing notes.  Pertinent labs results that were available during my care of the patient were reviewed by me and  considered in my medical decision making (see chart for details).  Acute urinary retention.  Last creatinine on record was from 2 years ago and was normal.  I see no prior records ED visits for urinary retention.  Attempts to place a Foley catheter were unsuccessful.  Case is discussed with Dr. Gloriann Loan of urology service who agrees to come to the ED to catheterize the patient.  Catheter has been placed. He is given a dose of ceftriaxone, and discharged with a prescription for cephalexin. Follow up with Dr. Gloriann Loan.  Final Clinical Impressions(s) / ED Diagnoses   Final diagnoses:  Urinary retention    New Prescriptions New Prescriptions   CEPHALEXIN (KEFLEX) 500 MG CAPSULE    Take 1 capsule (500 mg total) by mouth 3 (three) times daily.     Delora Fuel, MD 16/57/90 [phone]

## 2016-12-08 DIAGNOSIS — N401 Enlarged prostate with lower urinary tract symptoms: Secondary | ICD-10-CM | POA: Diagnosis not present

## 2016-12-08 DIAGNOSIS — R338 Other retention of urine: Secondary | ICD-10-CM | POA: Diagnosis not present

## 2016-12-08 LAB — URINALYSIS, ROUTINE W REFLEX MICROSCOPIC
BILIRUBIN URINE: NEGATIVE
Bacteria, UA: NONE SEEN
Glucose, UA: NEGATIVE mg/dL
Ketones, ur: 20 mg/dL — AB
LEUKOCYTES UA: NEGATIVE
NITRITE: NEGATIVE
PROTEIN: 30 mg/dL — AB
SPECIFIC GRAVITY, URINE: 1.017 (ref 1.005–1.030)
Squamous Epithelial / LPF: NONE SEEN
pH: 6 (ref 5.0–8.0)

## 2016-12-08 MED ORDER — CEPHALEXIN 500 MG PO CAPS: 500.0000 mg | ORAL_CAPSULE | Freq: Three times a day (TID) | ORAL | 0 refills | Status: DC

## 2016-12-08 MED ORDER — STERILE WATER FOR INJECTION IJ SOLN
INTRAMUSCULAR | Status: AC
  Administered 2016-12-08: 10 mL
  Filled 2016-12-08: qty 10

## 2016-12-08 MED ORDER — LORAZEPAM 1 MG PO TABS
1.0000 mg | ORAL_TABLET | Freq: Once | ORAL | Status: AC
  Administered 2016-12-08: 1 mg via ORAL
  Filled 2016-12-08: qty 1

## 2016-12-08 MED ORDER — CEFTRIAXONE SODIUM 1 G IJ SOLR
1.0000 g | INTRAMUSCULAR | Status: DC
  Administered 2016-12-08: 1 g via INTRAMUSCULAR
  Filled 2016-12-08: qty 10

## 2016-12-08 NOTE — Consult Note (Addendum)
H&P Physician requesting consult: Dan urinary retentionavid Roxanne Mins, MD  Chief Complaint: Urinary retention  History of Present Illness: 66 year old male with a history of urinary retention that required cystoscopy with catheter placement over a wire several years ago presents with acute urinary retention since this morning.  He states that before this he was voiding well.  He has a history of elevated PSA and had a negative prostate biopsy in the past.  Past Medical History:  Diagnosis Date  . Carbon monoxide poisoning 11/05/2014   using a saw which is driven by propane inside the building  . Hyperlipidemia    Past Surgical History:  Procedure Laterality Date  . FINGER FRACTURE SURGERY Left    "pointer"  . FRACTURE SURGERY    . PROSTATE BIOPSY  2000's    Home Medications:   (Not in a hospital admission) Allergies: No Known Allergies  Family History  Problem Relation Age of Onset  . Colon cancer Neg Hx   . Esophageal cancer Neg Hx   . Rectal cancer Neg Hx   . Stomach cancer Neg Hx   . Pancreatic cancer Neg Hx   . Prostate cancer Father    Social History:  reports that he has never smoked. He has never used smokeless tobacco. He reports that he does not drink alcohol or use drugs.  ROS: A complete review of systems was performed.  All systems are negative except for pertinent findings as noted. ROS   Physical Exam:  Vital signs in last 24 hours: Temp:  [98.8 F (37.1 C)] 98.8 F (37.1 C) (11/01 2246) Pulse Rate:  [72-110] 81 (11/02 0429) Resp:  [14-18] 14 (11/02 0429) BP: (125-163)/(76-115) 132/90 (11/02 0429) SpO2:  [93 %-100 %] 100 % (11/02 0429) Weight:  [81.6 kg (180 lb)] 81.6 kg (180 lb) (11/01 2246) General:  Alert and oriented, No acute distress HEENT: Normocephalic, atraumatic Neck: No JVD or lymphadenopathy Cardiovascular: Regular rate and rhythm Lungs: Regular rate and effort Abdomen: Soft, nontender, nondistended, no abdominal masses Back: No CVA  tenderness  GU: normal phallus Extremities: No edema Neurologic: Grossly intact  Laboratory Data:  Results for orders placed or performed during the hospital encounter of 12/07/16 (from the past 24 hour(s))  Urinalysis, Routine w reflex microscopic- may I&O cath if menses     Status: Abnormal   Collection Time: 12/08/16  3:20 AM  Result Value Ref Range   Color, Urine YELLOW YELLOW   APPearance CLEAR CLEAR   Specific Gravity, Urine 1.017 1.005 - 1.030   pH 6.0 5.0 - 8.0   Glucose, UA NEGATIVE NEGATIVE mg/dL   Hgb urine dipstick LARGE (A) NEGATIVE   Bilirubin Urine NEGATIVE NEGATIVE   Ketones, ur 20 (A) NEGATIVE mg/dL   Protein, ur 30 (A) NEGATIVE mg/dL   Nitrite NEGATIVE NEGATIVE   Leukocytes, UA NEGATIVE NEGATIVE   RBC / HPF TOO NUMEROUS TO COUNT 0 - 5 RBC/hpf   WBC, UA 6-30 0 - 5 WBC/hpf   Bacteria, UA NONE SEEN NONE SEEN   Squamous Epithelial / LPF NONE SEEN NONE SEEN   Mucus PRESENT    No results found for this or any previous visit (from the past 240 hour(s)). Creatinine: No results for input(s): CREATININE in the last 168 hours.  Procedure: Cystoscopy with aspiration and complex catheter placement over a wire The patient was identified and consent was obtained.  He was prepped and draped and I attempted to place a 52 French coud tip catheter.  This met resistance.  I then advanced a flexible cystoscope into the urethra.  The prostate was massively enlarged.  There was severe trilobar hypertrophy.  The scope was navigated with some degree of difficulty finally into the bladder.  A sensor wire was advanced into the bladder.  The scope was withdrawn.  I attempted to place a Council tip catheter over the wire but resistance was met.  I therefore reinserted the cystoscope and confirmed again that the wire was in the bladder.  Prior to removing the scope I aspirated the bladder and removed about 1 L of urine to relieve the patient's pressure.  I readvanced the wire through the scope.   I then withdrew the scope and this time I was able to easily pass a 16 [place] council tip catheter over the wire and into the bladder and I placed 10 cc of sterile water into the catheter balloon.  Impression/Assessment:  Urinary retention secondary to BPH  Plan:  Patient will keep his catheter and he will come back to clinic and we will discuss possible transurethral resection of the prostate.  I would like to repeat his PSA especially given his history of elevated PSA as well as a family history of prostate cancer.  I asked the emergency department to give him an antibiotic with good GU coverage given the the degree of instrumentation I had to do tonight.  Dan Mathis, III 12/08/2016, 8:04 AM

## 2016-12-08 NOTE — ED Notes (Signed)
Attempted foley catheter placement with 16g x2 with no urine return. Blood clots noted and witnessed by Feliberto Gottron, RN. Attempted foley catheter placement again with 20g with same results. Dr. Roxanne Mins notified

## 2016-12-15 DIAGNOSIS — R338 Other retention of urine: Secondary | ICD-10-CM | POA: Diagnosis not present

## 2016-12-20 ENCOUNTER — Encounter (HOSPITAL_COMMUNITY): Payer: Self-pay | Admitting: Emergency Medicine

## 2016-12-20 ENCOUNTER — Other Ambulatory Visit: Payer: Self-pay

## 2016-12-20 DIAGNOSIS — N390 Urinary tract infection, site not specified: Secondary | ICD-10-CM | POA: Diagnosis not present

## 2016-12-20 DIAGNOSIS — R339 Retention of urine, unspecified: Secondary | ICD-10-CM | POA: Diagnosis not present

## 2016-12-20 DIAGNOSIS — N183 Chronic kidney disease, stage 3 (moderate): Secondary | ICD-10-CM | POA: Insufficient documentation

## 2016-12-20 DIAGNOSIS — R338 Other retention of urine: Secondary | ICD-10-CM | POA: Diagnosis not present

## 2016-12-20 DIAGNOSIS — Z8042 Family history of malignant neoplasm of prostate: Secondary | ICD-10-CM | POA: Diagnosis not present

## 2016-12-20 DIAGNOSIS — N401 Enlarged prostate with lower urinary tract symptoms: Secondary | ICD-10-CM | POA: Diagnosis not present

## 2016-12-20 MED ORDER — LIDOCAINE HCL 2 % EX GEL
1.0000 "application " | Freq: Once | CUTANEOUS | Status: DC | PRN
  Filled 2016-12-20 (×2): qty 11

## 2016-12-20 NOTE — ED Triage Notes (Signed)
Pt states he was seen here on November 1st and had a catheter placed for urinary retention  Pt states he was seen at the urologist office this morning and they removed it  Pt states he has not been able to void since 10 am  Pt states he feels full but is not having any pain  Pt states last time they had to call the urologist in to place the catheter

## 2016-12-21 ENCOUNTER — Emergency Department (HOSPITAL_COMMUNITY)
Admission: EM | Admit: 2016-12-21 | Discharge: 2016-12-21 | Disposition: A | Discharge status: Home / Self Care | Payer: PPO | Attending: Emergency Medicine | Admitting: Emergency Medicine

## 2016-12-21 DIAGNOSIS — R339 Retention of urine, unspecified: Secondary | ICD-10-CM

## 2016-12-21 DIAGNOSIS — N390 Urinary tract infection, site not specified: Secondary | ICD-10-CM

## 2016-12-21 LAB — I-STAT CHEM 8, ED
BUN: 19 mg/dL (ref 6–20)
CREATININE: 1.1 mg/dL (ref 0.61–1.24)
Calcium, Ion: 1.11 mmol/L — ABNORMAL LOW (ref 1.15–1.40)
Chloride: 107 mmol/L (ref 101–111)
GLUCOSE: 131 mg/dL — AB (ref 65–99)
HEMATOCRIT: 35 % — AB (ref 39.0–52.0)
Hemoglobin: 11.9 g/dL — ABNORMAL LOW (ref 13.0–17.0)
POTASSIUM: 4.9 mmol/L (ref 3.5–5.1)
Sodium: 140 mmol/L (ref 135–145)
TCO2: 26 mmol/L (ref 22–32)

## 2016-12-21 LAB — URINALYSIS, ROUTINE W REFLEX MICROSCOPIC
BACTERIA UA: NONE SEEN
Bilirubin Urine: NEGATIVE
Glucose, UA: NEGATIVE mg/dL
Ketones, ur: 5 mg/dL — AB
NITRITE: NEGATIVE
Protein, ur: NEGATIVE mg/dL
SPECIFIC GRAVITY, URINE: 1.014 (ref 1.005–1.030)
SQUAMOUS EPITHELIAL / LPF: NONE SEEN
pH: 7 (ref 5.0–8.0)

## 2016-12-21 MED ORDER — LORAZEPAM 2 MG/ML IJ SOLN
1.0000 mg | INTRAMUSCULAR | Status: AC
  Administered 2016-12-21: 1 mg via INTRAVENOUS
  Filled 2016-12-21: qty 1

## 2016-12-21 MED ORDER — LIDOCAINE HCL (PF) 1 % IJ SOLN
INTRAMUSCULAR | Status: AC
  Administered 2016-12-21: 2.1 mL via INTRAMUSCULAR
  Filled 2016-12-21: qty 5

## 2016-12-21 MED ORDER — CEFTRIAXONE SODIUM 1 G IJ SOLR
1.0000 g | Freq: Once | INTRAMUSCULAR | Status: AC
  Administered 2016-12-21: 1 g via INTRAMUSCULAR
  Filled 2016-12-21: qty 10

## 2016-12-21 MED ORDER — CEPHALEXIN 500 MG PO CAPS: 500.0000 mg | ORAL_CAPSULE | Freq: Four times a day (QID) | ORAL | 0 refills | Status: DC

## 2016-12-21 MED ORDER — LORAZEPAM BOLUS VIA INFUSION: 1.0000 mg | INTRAVENOUS | Status: DC

## 2016-12-21 MED ORDER — SILODOSIN 4 MG PO CAPS: 4.0000 mg | ORAL_CAPSULE | Freq: Every day | ORAL | 0 refills | Status: DC

## 2016-12-21 NOTE — ED Notes (Signed)
Urology at bedside.

## 2016-12-21 NOTE — ED Notes (Signed)
Attempted to cath pt using a #16 coude cath  Unsuccessful  Did not meet resistance just was unable to obtain urine

## 2016-12-21 NOTE — ED Provider Notes (Signed)
Dan Mathis DEPT Provider Note   CSN: [id number] Arrival date & time: 12/20/16  2132     History   Chief Complaint Chief Complaint  Patient presents with  . Urinary Retention    HPI Dan Mathis is a 66 y.o. male.  The history is provided by the patient.  Illness  This is a recurrent problem. The current episode started 6 to 12 hours ago. The problem occurs constantly. The problem has not changed since onset.Pertinent negatives include no chest pain, no headaches and no shortness of breath. Nothing aggravates the symptoms. Nothing relieves the symptoms. He has tried nothing for the symptoms. The treatment provided no relief.  Had foley removed in urology office and was only able to trickle told to call back at 3, things were worse.  Now not voiding at all.    Past Medical History:  Diagnosis Date  . Carbon monoxide poisoning 11/05/2014   using a saw which is driven by propane inside the building  . Hyperlipidemia     Patient Active Problem List   Diagnosis Date Noted  . CKD (chronic kidney disease) stage 3, GFR 30-59 ml/min (HCC) 11/05/2014  . Lactic acidosis 11/05/2014    Past Surgical History:  Procedure Laterality Date  . FINGER FRACTURE SURGERY Left    "pointer"  . FRACTURE SURGERY    . PROSTATE BIOPSY  2000's       Home Medications    Prior to Admission medications   Medication Sig Start Date End Date Taking? Authorizing Provider  acetaminophen (TYLENOL) 500 MG tablet Take 1,000 mg by mouth every 6 (six) hours as needed for mild pain.    [provider]  cephALEXin (KEFLEX) 500 MG capsule Take 1 capsule (500 mg total) by mouth 3 (three) times daily. 40/9/81   Delora Fuel, MD  pravastatin (PRAVACHOL) 40 MG tablet Take 1 tablet (40 mg total) by mouth daily. Patient not taking: Reported on 12/08/2016 01/03/15   Micheline Revak, NP    Family History Family History  Problem Relation Age of Onset  . Prostate cancer  Father   . Colon cancer Neg Hx   . Esophageal cancer Neg Hx   . Rectal cancer Neg Hx   . Stomach cancer Neg Hx   . Pancreatic cancer Neg Hx     Social History Social History   Tobacco Use  . Smoking status: Never Smoker  . Smokeless tobacco: Never Used  Substance Use Topics  . Alcohol use: No    Alcohol/week: 0.0 oz  . Drug use: No     Allergies   Patient has no known allergies.   Review of Systems Review of Systems  Constitutional: Negative for fever.  Respiratory: Negative for shortness of breath.   Cardiovascular: Negative for chest pain.  Genitourinary: Positive for difficulty urinating.  Neurological: Negative for headaches.  All other systems reviewed and are negative.    Physical Exam Updated Vital Signs BP (!) 156/94   Pulse 95   Temp 98.2 F (36.8 C)   Resp 18   SpO2 100%   Physical Exam  Constitutional: He is oriented to person, place, and time. He appears well-developed and well-nourished. No distress.  HENT:  Head: Normocephalic and atraumatic.  Mouth/Throat: No oropharyngeal exudate.  Eyes: Conjunctivae are normal. Pupils are equal, round, and reactive to light.  Neck: Normal range of motion. Neck supple.  Cardiovascular: Normal rate, regular rhythm, normal heart sounds and intact distal pulses.  Pulmonary/Chest: Effort normal and  breath sounds normal. No stridor. He has no wheezes. He has no rales.  Abdominal: Soft. Bowel sounds are normal. He exhibits no mass. There is no tenderness. There is no rebound and no guarding.  Musculoskeletal: Normal range of motion.  Neurological: He is alert and oriented to person, place, and time. He displays normal reflexes.  Skin: Skin is warm and dry. Capillary refill takes less than 2 seconds.  Psychiatric: He has a normal mood and affect.     ED Treatments / Results   Vitals:   12/20/16 2236 12/21/16 0107  BP: (!) 156/94 (!) 147/122  Pulse: 95 75  Resp: 18 20  Temp: 98.2 F (36.8 C) 98.8 F (37.1  C)  SpO2: 100% 100%     Procedures Procedures (including critical care time)  Medications Ordered in ED Medications  lidocaine (XYLOCAINE) 2 % jelly 1 application (not administered)     Seen by Dr. Gloriann Loan of urology for foley catheter placement.    Final Clinical Impressions(s) / ED Diagnoses    All questions answered to the patient's satisfaction.   Strict return precautions for swelling or the lips or tongue, chest pain, dyspnea on exertion, new weakness or numbness changes in vision or speech, fevers, weakness persistent pain, Inability to tolerate liquids or food, changes in voice cough, altered mental status or any concerns. No signs of systemic illness or infection. The patient is nontoxic-appearing on exam and vital signs are within normal limits.    I have reviewed the triage vital signs and the nursing notes. Pertinent labs &imaging results that were available during my care of the patient were reviewed by me and considered in my medical decision making (see chart for details).  After history, exam, and medical workup I feel the patient has been appropriately medically screened and is safe for discharge home. Pertinent diagnoses were discussed with the patient. Patient was given return precautions.     Diavion Labrador, MD 12/21/16 [phone]

## 2016-12-21 NOTE — ED Notes (Signed)
Bladder scan read >624

## 2016-12-22 LAB — URINE CULTURE: Special Requests: NORMAL

## 2016-12-27 DIAGNOSIS — N401 Enlarged prostate with lower urinary tract symptoms: Secondary | ICD-10-CM | POA: Diagnosis not present

## 2016-12-27 DIAGNOSIS — R972 Elevated prostate specific antigen [PSA]: Secondary | ICD-10-CM | POA: Diagnosis not present

## 2016-12-27 DIAGNOSIS — R339 Retention of urine, unspecified: Secondary | ICD-10-CM | POA: Diagnosis not present

## 2017-01-15 ENCOUNTER — Ambulatory Visit (INDEPENDENT_AMBULATORY_CARE_PROVIDER_SITE_OTHER): Payer: PPO | Admitting: Physician Assistant

## 2017-01-19 DIAGNOSIS — R972 Elevated prostate specific antigen [PSA]: Secondary | ICD-10-CM | POA: Diagnosis not present

## 2017-01-25 ENCOUNTER — Other Ambulatory Visit: Payer: Self-pay | Admitting: Urology

## 2017-01-25 DIAGNOSIS — R972 Elevated prostate specific antigen [PSA]: Secondary | ICD-10-CM

## 2017-01-25 DIAGNOSIS — R338 Other retention of urine: Secondary | ICD-10-CM | POA: Diagnosis not present

## 2017-01-25 DIAGNOSIS — N401 Enlarged prostate with lower urinary tract symptoms: Secondary | ICD-10-CM | POA: Diagnosis not present

## 2017-03-06 DIAGNOSIS — R338 Other retention of urine: Secondary | ICD-10-CM | POA: Diagnosis not present

## 2017-03-06 DIAGNOSIS — N401 Enlarged prostate with lower urinary tract symptoms: Secondary | ICD-10-CM | POA: Diagnosis not present

## 2017-03-08 ENCOUNTER — Ambulatory Visit
Admission: RE | Admit: 2017-03-08 | Discharge: 2017-03-08 | Disposition: A | Discharge status: Home / Self Care | Payer: PPO | Source: Ambulatory Visit | Attending: Urology | Admitting: Urology

## 2017-03-08 ENCOUNTER — Other Ambulatory Visit: Payer: Self-pay | Admitting: Urology

## 2017-03-08 DIAGNOSIS — R972 Elevated prostate specific antigen [PSA]: Secondary | ICD-10-CM | POA: Diagnosis not present

## 2017-03-08 DIAGNOSIS — Z77018 Contact with and (suspected) exposure to other hazardous metals: Secondary | ICD-10-CM

## 2017-03-08 DIAGNOSIS — Z135 Encounter for screening for eye and ear disorders: Secondary | ICD-10-CM | POA: Diagnosis not present

## 2017-03-08 MED ORDER — GADOBENATE DIMEGLUMINE 529 MG/ML IV SOLN
17.0000 mL | Freq: Once | INTRAVENOUS | Status: AC | PRN
  Administered 2017-03-08: 17 mL via INTRAVENOUS

## 2017-03-14 ENCOUNTER — Other Ambulatory Visit: Payer: Self-pay | Admitting: Urology

## 2017-04-05 DIAGNOSIS — R338 Other retention of urine: Secondary | ICD-10-CM | POA: Diagnosis not present

## 2017-04-05 DIAGNOSIS — N401 Enlarged prostate with lower urinary tract symptoms: Secondary | ICD-10-CM | POA: Diagnosis not present

## 2017-04-06 ENCOUNTER — Encounter (HOSPITAL_COMMUNITY): Payer: Self-pay

## 2017-04-10 ENCOUNTER — Encounter (HOSPITAL_COMMUNITY): Payer: Self-pay

## 2017-04-10 ENCOUNTER — Other Ambulatory Visit: Payer: Self-pay

## 2017-04-10 ENCOUNTER — Encounter (HOSPITAL_COMMUNITY)
Admission: RE | Admit: 2017-04-10 | Discharge: 2017-04-10 | Disposition: A | Discharge status: Home / Self Care | Payer: PPO | Source: Ambulatory Visit | Attending: Urology | Admitting: Urology

## 2017-04-10 DIAGNOSIS — Z01812 Encounter for preprocedural laboratory examination: Secondary | ICD-10-CM | POA: Diagnosis not present

## 2017-04-10 HISTORY — DX: Nausea with vomiting, unspecified: R11.2

## 2017-04-10 HISTORY — DX: Retention of urine, unspecified: R33.9

## 2017-04-10 HISTORY — DX: Nausea with vomiting, unspecified: Z98.890

## 2017-04-10 HISTORY — DX: Syncope and collapse: R55

## 2017-04-10 HISTORY — DX: Other hemorrhoids: K64.8

## 2017-04-10 LAB — PROTIME-INR
INR: 1.16
Prothrombin Time: 14.7 seconds (ref 11.4–15.2)

## 2017-04-10 LAB — BASIC METABOLIC PANEL
Anion gap: 7 (ref 5–15)
BUN: 14 mg/dL (ref 6–20)
CALCIUM: 9.4 mg/dL (ref 8.9–10.3)
CO2: 24 mmol/L (ref 22–32)
CREATININE: 1.36 mg/dL — AB (ref 0.61–1.24)
Chloride: 113 mmol/L — ABNORMAL HIGH (ref 101–111)
GFR, EST NON AFRICAN AMERICAN: 52 mL/min — AB (ref >60)
Glucose, Bld: 90 mg/dL (ref 65–99)
Potassium: 3.9 mmol/L (ref 3.5–5.1)
SODIUM: 144 mmol/L (ref 135–145)

## 2017-04-10 LAB — CBC
HCT: 35.5 % — ABNORMAL LOW (ref 39.0–52.0)
Hemoglobin: 11.5 g/dL — ABNORMAL LOW (ref 13.0–17.0)
MCH: 29.1 pg (ref 26.0–34.0)
MCHC: 32.4 g/dL (ref 30.0–36.0)
MCV: 89.9 fL (ref 78.0–100.0)
PLATELETS: 169 10*3/uL (ref 150–400)
RBC: 3.95 MIL/uL — AB (ref 4.22–5.81)
RDW: 14.7 % (ref 11.5–15.5)
WBC: 3.2 10*3/uL — AB (ref 4.0–10.5)

## 2017-04-10 LAB — ABO/RH: ABO/RH(D): AB POS

## 2017-04-10 NOTE — Pre-Procedure Instructions (Signed)
CBC and BMP results 04/10/2017 faxed to Dr. Gloriann Loan via epic.

## 2017-04-10 NOTE — Patient Instructions (Addendum)
Your procedure is scheduled on: Tuesday, April 17, 2017   Surgery Time:  7:30AM-10:30AM   Report to [facility] by 5:30 AM pick up the phone at the front desk dial [phone], have a seat in the lobby and a staff member will come and escort you to Short Stay Department   Call this number if you have problems the morning of surgery [phone]   Do not eat food or drink liquids :After Midnight.   Do NOT smoke after Midnight   Take these medicines the morning of surgery with A SIP OF WATER: Tamsulosin                               You may not have any metal on your body including  jewelry, and body piercings             Do not wear lotions, powders, perfumes/cologne, or deodorant                           Men may shave face and neck.   Do not bring valuables to the hospital. [facility].   Contacts, dentures or bridgework may not be worn into surgery.   Leave suitcase in the car. After surgery it may be brought to your room.   Special Instructions: Bring a copy of your healthcare power of attorney and living will documents         the day of surgery if you haven't scanned them in before.              Please read over the following fact sheets you were given:  [facility] - Preparing for Surgery Before surgery, you can play an important role.  Because skin is not sterile, your skin needs to be as free of germs as possible.  You can reduce the number of germs on your skin by washing with CHG (chlorahexidine gluconate) soap before surgery.  CHG is an antiseptic cleaner which kills germs and bonds with the skin to continue killing germs even after washing. Please DO NOT use if you have an allergy to CHG or antibacterial soaps.  If your skin becomes reddened/irritated stop using the CHG and inform your nurse when you arrive at Short Stay. Do not shave (including legs and underarms) for at least 48 hours  prior to the first CHG shower.  You may shave your face/neck.  Please follow these instructions carefully:  1.  Shower with CHG Soap the night before surgery and the  morning of surgery.  2.  If you choose to wash your hair, wash your hair first as usual with your normal  shampoo.  3.  After you shampoo, rinse your hair and body thoroughly to remove the shampoo.                             4.  Use CHG as you would any other liquid soap.  You can apply chg directly to the skin and wash.  Gently with a scrungie or clean washcloth.  5.  Apply the CHG Soap to your body ONLY FROM THE NECK DOWN.   Do   not use on face/ open  Wound or open sores. Avoid contact with eyes, ears mouth and   genitals (private parts).                       Wash face,  Genitals (private parts) with your normal soap.             6.  Wash thoroughly, paying special attention to the area where your    surgery  will be performed.  7.  Thoroughly rinse your body with warm water from the neck down.  8.  DO NOT shower/wash with your normal soap after using and rinsing off the CHG Soap.                9.  Pat yourself dry with a clean towel.            10.  Wear clean pajamas.            11.  Place clean sheets on your bed the night of your first shower and do not  sleep with pets. Day of Surgery : Do not apply any lotions/deodorants the morning of surgery.  Please wear clean clothes to the hospital/surgery center.  FAILURE TO FOLLOW THESE INSTRUCTIONS MAY RESULT IN THE CANCELLATION OF YOUR SURGERY  PATIENT SIGNATURE_________________________________  NURSE SIGNATURE__________________________________  ________________________________________________________________________  WHAT IS A BLOOD TRANSFUSION? Blood Transfusion Information  A transfusion is the replacement of blood or some of its parts. Blood is made up of multiple cells which provide different functions.  Red blood cells carry oxygen and  are used for blood loss replacement.  White blood cells fight against infection.  Platelets control bleeding.  Plasma helps clot blood.  Other blood products are available for specialized needs, such as hemophilia or other clotting disorders. BEFORE THE TRANSFUSION  Who gives blood for transfusions?   Healthy volunteers who are fully evaluated to make sure their blood is safe. This is blood bank blood. Transfusion therapy is the safest it has ever been in the practice of medicine. Before blood is taken from a donor, a complete history is taken to make sure that person has no history of diseases nor engages in risky social behavior (examples are intravenous drug use or sexual activity with multiple partners). The donor's travel history is screened to minimize risk of transmitting infections, such as malaria. The donated blood is tested for signs of infectious diseases, such as HIV and hepatitis. The blood is then tested to be sure it is compatible with you in order to minimize the chance of a transfusion reaction. If you or a relative donates blood, this is often done in anticipation of surgery and is not appropriate for emergency situations. It takes many days to process the donated blood. RISKS AND COMPLICATIONS Although transfusion therapy is very safe and saves many lives, the main dangers of transfusion include:   Getting an infectious disease.  Developing a transfusion reaction. This is an allergic reaction to something in the blood you were given. Every precaution is taken to prevent this. The decision to have a blood transfusion has been considered carefully by your caregiver before blood is given. Blood is not given unless the benefits outweigh the risks. AFTER THE TRANSFUSION  Right after receiving a blood transfusion, you will usually feel much better and more energetic. This is especially true if your red blood cells have gotten low (anemic). The transfusion raises the level of the  red blood cells which carry oxygen, and this usually  causes an energy increase.  The nurse administering the transfusion will monitor you carefully for complications. HOME CARE INSTRUCTIONS  No special instructions are needed after a transfusion. You may find your energy is better. Speak with your caregiver about any limitations on activity for underlying diseases you may have. SEEK MEDICAL CARE IF:   Your condition is not improving after your transfusion.  You develop redness or irritation at the intravenous (IV) site. SEEK IMMEDIATE MEDICAL CARE IF:  Any of the following symptoms occur over the next 12 hours:  Shaking chills.  You have a temperature by mouth above 102 F (38.9 C), not controlled by medicine.  Chest, back, or muscle pain.  People around you feel you are not acting correctly or are confused.  Shortness of breath or difficulty breathing.  Dizziness and fainting.  You get a rash or develop hives.  You have a decrease in urine output.  Your urine turns a dark color or changes to pink, red, or brown. Any of the following symptoms occur over the next 10 days:  You have a temperature by mouth above 102 F (38.9 C), not controlled by medicine.  Shortness of breath.  Weakness after normal activity.  The white part of the eye turns yellow (jaundice).  You have a decrease in the amount of urine or are urinating less often.  Your urine turns a dark color or changes to pink, red, or brown. Document Released: 01/21/2000 Document Revised: 04/17/2011 Document Reviewed: 09/09/2007 [facility] Patient Information 2014 [place], [place].  _______________________________________________________________________

## 2017-04-16 ENCOUNTER — Encounter (HOSPITAL_COMMUNITY): Payer: Self-pay | Admitting: Anesthesiology

## 2017-04-16 MED ORDER — TRANEXAMIC ACID 1000 MG/10ML IV SOLN
1000.0000 mg | INTRAVENOUS | Status: AC
  Administered 2017-04-17: 1000 mg via INTRAVENOUS
  Filled 2017-04-16: qty 1100

## 2017-04-16 NOTE — Anesthesia Preprocedure Evaluation (Addendum)
Anesthesia Evaluation  Patient identified by MRN, date of birth, ID band Patient awake    Reviewed: Allergy & Precautions, NPO status , Patient's Chart, lab work & pertinent test results  History of Anesthesia Complications (+) PONV and history of anesthetic complications  Airway Mallampati: II  TM Distance: >3 FB Neck ROM: Full    Dental  (+) Partial Upper, Poor Dentition, Missing, Dental Advisory Given   Pulmonary  Hx/o CO poisoning 2014   Pulmonary exam normal breath sounds clear to auscultation       Cardiovascular negative cardio ROS Normal cardiovascular exam Rhythm:Regular Rate:Normal     Neuro/Psych negative neurological ROS  negative psych ROS   GI/Hepatic negative GI ROS, Neg liver ROS,   Endo/Other  Hyperlipidemia  Renal/GU Renal disease   BPH with urinary retention    Musculoskeletal negative musculoskeletal ROS (+)   Abdominal   Peds  Hematology negative hematology ROS (+)   Anesthesia Other Findings   Reproductive/Obstetrics                          Anesthesia Physical Anesthesia Plan  ASA: II  Anesthesia Plan: General   Post-op Pain Management:    Induction: Intravenous  PONV Risk Score and Plan: 4 or greater and Scopolamine patch - Pre-op, Ondansetron, Treatment may vary due to age or medical condition, Dexamethasone and Midazolam  Airway Management Planned: Oral ETT  Additional Equipment:   Intra-op Plan:   Post-operative Plan: Extubation in OR  Informed Consent: I have reviewed the patients History and Physical, chart, labs and discussed the procedure including the risks, benefits and alternatives for the proposed anesthesia with the patient or authorized representative who has indicated his/her understanding and acceptance.   Dental advisory given  Plan Discussed with: CRNA, Anesthesiologist and Surgeon  Anesthesia Plan Comments:         Anesthesia Quick Evaluation

## 2017-04-16 NOTE — H&P (Signed)
CC/HPI: CC: Catheter change  HPI: 67 year old male with a 400 g prostate and elevated PSA status post prostate biopsy that showed 1 core of atypia. MRI revealed a 250 g prostate. There was no evidence of high-grade lesions. He has decided to proceed with open simple prostatectomy. This is on the schedule. He presents today to have his catheter exchanged over wire.   ALLERGIES: No Allergies   MEDICATIONS: Tamsulosin Hcl 0.4 mg capsule 1 capsule PO Daily    GU PSH: Cystoscopy - 12/08/2016, 2010 Insert Bladder Cath; Complex - 03/06/2017, 01/25/2017 Prostate Needle Biopsy - 01/19/2017     PSH Notes: Hand Surgery, Cystoscopy (Diagnostic)   NON-GU PSH: Surgical Pathology, Gross And Microscopic Examination For Prostate Needle - 01/19/2017   GU PMH: Elevated PSA - 01/25/2017, Elevated prostate specific antigen (PSA), - 2014 Urinary Retention, Unspec - 01/19/2017, - 12/27/2016, Urinary retention, - 2014 Urinary Retention - 12/20/2016, - 12/15/2016 BPH w/LUTS, Benign prostatic hyperplasia with urinary obstruction - 2014 Encounter for Prostate Cancer screening, Prostate cancer screening - 2014     PMH Notes:  1898-02-06 00:00:00 - Note: Normal Routine History And Physical Adult   NON-GU PMH: Gastric ulcer, unspecified as acute or chronic, without hemorrhage or perforation, Gastric Ulcer - 2014 Hypercholesterolemia   FAMILY HISTORY: Family Health Status - Father alive at age 55 - 25 In Family Family Health Status - Mother's Age - Runs In Family Family Health Status Number - Runs In Family Prostate Cancer - Father renal failure - Father   SOCIAL HISTORY: Marital Status: Single Preferred Language: English; Ethnicity: Not Hispanic Or Latino; Race: White, Black or African American Current Smoking Status: Patient has never smoked.   Tobacco Use Assessment Completed:  Used Tobacco in last 30 days?  Has never drank.  Does not drink caffeine.    Notes: Tobacco Use, Alcohol Use, Caffeine Use,  Occupation:, Marital History - Single   REVIEW OF SYSTEMS:    GU Review Male:   Patient reports burning/ pain with urination and trouble starting your stream. Patient denies frequent urination, hard to postpone urination, get up at night to urinate, leakage of urine, stream starts and stops, have to strain to urinate , erection problems, and penile pain.  Gastrointestinal (Upper):   Patient denies nausea, vomiting, and indigestion/ heartburn.  Gastrointestinal (Lower):   Patient denies diarrhea and constipation.  Constitutional:   Patient denies fever, night sweats, weight loss, and fatigue.  Skin:   Patient denies skin rash/ lesion and itching.  Eyes:   Patient denies blurred vision and double vision.  Ears/ Nose/ Throat:   Patient denies sore throat and sinus problems.  Hematologic/Lymphatic:   Patient denies swollen glands and easy bruising.  Cardiovascular:   Patient denies leg swelling and chest pains.  Respiratory:   Patient denies cough and shortness of breath.  Endocrine:   Patient denies excessive thirst.  Musculoskeletal:   Patient denies back pain and joint pain.  Neurological:   Patient denies dizziness and headaches.  Psychologic:   Patient denies depression and anxiety.   VITAL SIGNS:      04/05/2017 11:19 AM  Weight 180 lb / 81.65 kg  Height 69 in / 175.26 cm  BP 151/88 mmHg  Pulse 79 /min  Temperature 98.0 F / 36.6 C  BMI 26.6 kg/m   GU PHYSICAL EXAMINATION:    Penis: Penis uncircumcised. No foreskin warts, no cracks. No dorsal peyronie's plaques, no left corporal peyronie's plaques, no right corporal peyronie's plaques, no scarring, no shaft warts.  No balanitis, no meatal stenosis.    MULTI-SYSTEM PHYSICAL EXAMINATION:    Constitutional: Well-nourished. No physical deformities. Normally developed. Good grooming.  Respiratory: No labored breathing, no use of accessory muscles. Normal breath sounds.  Cardiovascular: Normal temperature, adequate peripheral perfusion   Skin: No paleness, no jaundice  Neurologic / Psychiatric: Oriented to time, oriented to place, oriented to person. No depression, no anxiety, no agitation.  Gastrointestinal: No mass, no tenderness, no rigidity, non obese abdomen.  Eyes: Normal conjunctivae. Normal eyelids.  Musculoskeletal: Normal gait and station of head and neck.    PAST DATA REVIEWED:  Source Of History:  Patient   12/27/16 06/25/08 03/19/08 10/16/03  PSA  Total PSA 14.60 ng/mL 6.49  38.30  5.40   Free PSA  1.01   1.08   % Free PSA  15.6   20.0    PROCEDURES:         Catheter / SP Tube - 65784 Complicated Foley Catheterization  A sensor wire was advanced through the previous council-tip catheter and into the bladder. The catheter was removed. A __18____ [place] [employer] to Foley catheter was inserted into the bladder over a wire using sterile technique. The patient was taught routine catheter care. Hand irrigation of the bladder with sterile water was performed and this confirmed placement.. A leg bag was connected.   ASSESSMENT:      ICD-10 Details  1 GU:   BPH w/LUTS - N40.1   2   Urinary Retention - R33.8    PLAN:          Document Letter(s):  Created for Patient: Clinical Summary        Notes:   Proceed with open simple prostatectomy as planned and understands the risks including but not limited to bleeding possibly requiring blood transfusion, infection, injury to surrounding structures, need for additional procedures.        Next Appointment:      Next Appointment: 04/17/2017 07:30 AM    Appointment Type: Surgery     Location: [facility] [phone]    Provider: Link Snuffer, III, M.D.    Reason for Visit: TBA WL OPEN SIMPLE PROSTATECTOMY DR E TO AST     CARE TEAM: Link Snuffer, III, M.D. Whitney Early   Signed by Link Snuffer, III, M.D. on 04/05/17 at 11:59 AM (

## 2017-04-17 ENCOUNTER — Encounter (HOSPITAL_COMMUNITY)
Admission: RE | Disposition: A | Discharge status: Home Health | Payer: Self-pay | Source: Ambulatory Visit | Attending: Urology

## 2017-04-17 ENCOUNTER — Other Ambulatory Visit: Payer: Self-pay

## 2017-04-17 ENCOUNTER — Inpatient Hospital Stay (HOSPITAL_COMMUNITY): Payer: PPO | Admitting: Anesthesiology

## 2017-04-17 ENCOUNTER — Encounter (HOSPITAL_COMMUNITY): Payer: Self-pay

## 2017-04-17 ENCOUNTER — Inpatient Hospital Stay (HOSPITAL_COMMUNITY)
Admission: RE | Admit: 2017-04-17 | Discharge: 2017-04-22 | DRG: 717 | Disposition: A | Discharge status: Home Health | Payer: PPO | Source: Ambulatory Visit | Attending: Urology | Admitting: Urology

## 2017-04-17 DIAGNOSIS — N401 Enlarged prostate with lower urinary tract symptoms: Principal | ICD-10-CM | POA: Diagnosis present

## 2017-04-17 DIAGNOSIS — D62 Acute posthemorrhagic anemia: Secondary | ICD-10-CM | POA: Diagnosis not present

## 2017-04-17 DIAGNOSIS — N183 Chronic kidney disease, stage 3 (moderate): Secondary | ICD-10-CM | POA: Diagnosis not present

## 2017-04-17 DIAGNOSIS — R338 Other retention of urine: Secondary | ICD-10-CM | POA: Diagnosis present

## 2017-04-17 DIAGNOSIS — E785 Hyperlipidemia, unspecified: Secondary | ICD-10-CM | POA: Diagnosis not present

## 2017-04-17 DIAGNOSIS — N400 Enlarged prostate without lower urinary tract symptoms: Secondary | ICD-10-CM | POA: Diagnosis present

## 2017-04-17 DIAGNOSIS — Y846 Urinary catheterization as the cause of abnormal reaction of the patient, or of later complication, without mention of misadventure at the time of the procedure: Secondary | ICD-10-CM | POA: Diagnosis not present

## 2017-04-17 DIAGNOSIS — Y92230 Patient room in hospital as the place of occurrence of the external cause: Secondary | ICD-10-CM | POA: Diagnosis not present

## 2017-04-17 DIAGNOSIS — T83091A Other mechanical complication of indwelling urethral catheter, initial encounter: Secondary | ICD-10-CM | POA: Diagnosis not present

## 2017-04-17 DIAGNOSIS — Z8042 Family history of malignant neoplasm of prostate: Secondary | ICD-10-CM | POA: Diagnosis not present

## 2017-04-17 DIAGNOSIS — E872 Acidosis: Secondary | ICD-10-CM | POA: Diagnosis not present

## 2017-04-17 HISTORY — PX: PROSTATECTOMY: SHX69

## 2017-04-17 LAB — HEMOGLOBIN AND HEMATOCRIT, BLOOD
HCT: 22.8 % — ABNORMAL LOW (ref 39.0–52.0)
HEMATOCRIT: 24.1 % — AB (ref 39.0–52.0)
HEMOGLOBIN: 8 g/dL — AB (ref 13.0–17.0)
Hemoglobin: 7.5 g/dL — ABNORMAL LOW (ref 13.0–17.0)

## 2017-04-17 LAB — BASIC METABOLIC PANEL
Anion gap: 9 (ref 5–15)
BUN: 16 mg/dL (ref 6–20)
CO2: 23 mmol/L (ref 22–32)
Calcium: 8.1 mg/dL — ABNORMAL LOW (ref 8.9–10.3)
Chloride: 109 mmol/L (ref 101–111)
Creatinine, Ser: 1.32 mg/dL — ABNORMAL HIGH (ref 0.61–1.24)
GFR calc Af Amer: >60 mL/min (ref >60)
GFR, EST NON AFRICAN AMERICAN: 53 mL/min — AB (ref >60)
GLUCOSE: 209 mg/dL — AB (ref 65–99)
Potassium: 3.3 mmol/L — ABNORMAL LOW (ref 3.5–5.1)
Sodium: 141 mmol/L (ref 135–145)

## 2017-04-17 LAB — PREPARE RBC (CROSSMATCH)

## 2017-04-17 SURGERY — PROSTATECTOMY
Anesthesia: General

## 2017-04-17 MED ORDER — SODIUM CHLORIDE 0.9 % IR SOLN
3000.0000 mL | Status: DC
  Administered 2017-04-17 – 2017-04-18 (×3): 3000 mL

## 2017-04-17 MED ORDER — SENNOSIDES-DOCUSATE SODIUM 8.6-50 MG PO TABS
2.0000 | ORAL_TABLET | Freq: Every day | ORAL | Status: DC
  Administered 2017-04-17 – 2017-04-21 (×5): 2 via ORAL
  Filled 2017-04-17 (×6): qty 2

## 2017-04-17 MED ORDER — BELLADONNA ALKALOIDS-OPIUM 16.2-60 MG RE SUPP
1.0000 | Freq: Four times a day (QID) | RECTAL | Status: DC | PRN
  Administered 2017-04-17: 1 via RECTAL

## 2017-04-17 MED ORDER — ROCURONIUM BROMIDE 10 MG/ML (PF) SYRINGE
PREFILLED_SYRINGE | INTRAVENOUS | Status: DC | PRN
  Administered 2017-04-17: 50 mg via INTRAVENOUS
  Administered 2017-04-17: 10 mg via INTRAVENOUS

## 2017-04-17 MED ORDER — SODIUM CHLORIDE 0.9 % IR SOLN
Status: DC | PRN
  Administered 2017-04-17: 3000 mL

## 2017-04-17 MED ORDER — SUCCINYLCHOLINE CHLORIDE 200 MG/10ML IV SOSY
PREFILLED_SYRINGE | INTRAVENOUS | Status: AC
  Filled 2017-04-17: qty 10

## 2017-04-17 MED ORDER — DEXAMETHASONE SODIUM PHOSPHATE 10 MG/ML IJ SOLN
INTRAMUSCULAR | Status: DC | PRN
  Administered 2017-04-17: 10 mg via INTRAVENOUS

## 2017-04-17 MED ORDER — ONDANSETRON HCL 4 MG/2ML IJ SOLN
INTRAMUSCULAR | Status: AC
  Filled 2017-04-17: qty 2

## 2017-04-17 MED ORDER — PROPOFOL 10 MG/ML IV BOLUS
INTRAVENOUS | Status: AC
  Filled 2017-04-17: qty 20

## 2017-04-17 MED ORDER — ALBUMIN HUMAN 5 % IV SOLN
INTRAVENOUS | Status: AC
  Filled 2017-04-17: qty 500

## 2017-04-17 MED ORDER — PHENYLEPHRINE 40 MCG/ML (10ML) SYRINGE FOR IV PUSH (FOR BLOOD PRESSURE SUPPORT)
PREFILLED_SYRINGE | INTRAVENOUS | Status: DC | PRN
  Administered 2017-04-17: 40 ug via INTRAVENOUS
  Administered 2017-04-17: 120 ug via INTRAVENOUS
  Administered 2017-04-17: 40 ug via INTRAVENOUS
  Administered 2017-04-17 (×2): 120 ug via INTRAVENOUS
  Administered 2017-04-17: 80 ug via INTRAVENOUS
  Administered 2017-04-17: 120 ug via INTRAVENOUS

## 2017-04-17 MED ORDER — ONDANSETRON HCL 4 MG/2ML IJ SOLN
INTRAMUSCULAR | Status: DC | PRN
  Administered 2017-04-17: 4 mg via INTRAVENOUS

## 2017-04-17 MED ORDER — PHENYLEPHRINE 40 MCG/ML (10ML) SYRINGE FOR IV PUSH (FOR BLOOD PRESSURE SUPPORT)
PREFILLED_SYRINGE | INTRAVENOUS | Status: AC
  Filled 2017-04-17: qty 10

## 2017-04-17 MED ORDER — BELLADONNA ALKALOIDS-OPIUM 16.2-60 MG RE SUPP
RECTAL | Status: AC
  Filled 2017-04-17: qty 1

## 2017-04-17 MED ORDER — LIP MEDEX EX OINT
TOPICAL_OINTMENT | CUTANEOUS | Status: AC
Start: 2017-04-17 — End: 2017-04-18
  Filled 2017-04-17: qty 7

## 2017-04-17 MED ORDER — PROPOFOL 10 MG/ML IV BOLUS
INTRAVENOUS | Status: DC | PRN
  Administered 2017-04-17: 150 mg via INTRAVENOUS

## 2017-04-17 MED ORDER — PHENYLEPHRINE HCL 10 MG/ML IJ SOLN
INTRAVENOUS | Status: DC | PRN
  Administered 2017-04-17: 25 ug/min via INTRAVENOUS

## 2017-04-17 MED ORDER — MIDAZOLAM HCL 5 MG/5ML IJ SOLN
INTRAMUSCULAR | Status: DC | PRN
  Administered 2017-04-17: 2 mg via INTRAVENOUS

## 2017-04-17 MED ORDER — DIPHENHYDRAMINE HCL 12.5 MG/5ML PO ELIX: 12.5000 mg | ORAL_SOLUTION | Freq: Four times a day (QID) | ORAL | Status: DC | PRN

## 2017-04-17 MED ORDER — PROMETHAZINE HCL 25 MG/ML IJ SOLN: 6.2500 mg | INTRAMUSCULAR | Status: DC | PRN

## 2017-04-17 MED ORDER — SODIUM CHLORIDE 0.9 % IV SOLN
INTRAVENOUS | Status: DC
  Administered 2017-04-17 – 2017-04-18 (×3): via INTRAVENOUS

## 2017-04-17 MED ORDER — LACTATED RINGERS IV SOLN
INTRAVENOUS | Status: DC | PRN
  Administered 2017-04-17 (×2): via INTRAVENOUS

## 2017-04-17 MED ORDER — BACITRACIN-NEOMYCIN-POLYMYXIN 400-5-5000 EX OINT: 1.0000 "application " | TOPICAL_OINTMENT | Freq: Three times a day (TID) | CUTANEOUS | Status: DC | PRN

## 2017-04-17 MED ORDER — MEPERIDINE HCL 50 MG/ML IJ SOLN: 6.2500 mg | INTRAMUSCULAR | Status: DC | PRN

## 2017-04-17 MED ORDER — ONDANSETRON HCL 4 MG/2ML IJ SOLN: 4.0000 mg | INTRAMUSCULAR | Status: DC | PRN

## 2017-04-17 MED ORDER — HYDRALAZINE HCL 20 MG/ML IJ SOLN: 10.0000 mg | Freq: Three times a day (TID) | INTRAMUSCULAR | Status: DC | PRN

## 2017-04-17 MED ORDER — SCOPOLAMINE 1 MG/3DAYS TD PT72
MEDICATED_PATCH | TRANSDERMAL | Status: DC | PRN
  Administered 2017-04-17: 1.5 mg via TRANSDERMAL

## 2017-04-17 MED ORDER — DIPHENHYDRAMINE HCL 50 MG/ML IJ SOLN: 12.5000 mg | Freq: Four times a day (QID) | INTRAMUSCULAR | Status: DC | PRN

## 2017-04-17 MED ORDER — BUPIVACAINE HCL 0.25 % IJ SOLN
INTRAMUSCULAR | Status: DC | PRN
  Administered 2017-04-17: 30 mL

## 2017-04-17 MED ORDER — ALBUTEROL SULFATE (2.5 MG/3ML) 0.083% IN NEBU
INHALATION_SOLUTION | RESPIRATORY_TRACT | Status: AC
  Filled 2017-04-17: qty 3

## 2017-04-17 MED ORDER — LIP MEDEX EX OINT
TOPICAL_OINTMENT | CUTANEOUS | Status: DC | PRN
  Filled 2017-04-17: qty 7

## 2017-04-17 MED ORDER — ALBUMIN HUMAN 5 % IV SOLN
12.5000 g | Freq: Once | INTRAVENOUS | Status: AC
  Administered 2017-04-17: 12.5 g via INTRAVENOUS

## 2017-04-17 MED ORDER — SUCCINYLCHOLINE CHLORIDE 200 MG/10ML IV SOSY
PREFILLED_SYRINGE | INTRAVENOUS | Status: DC | PRN
  Administered 2017-04-17: 120 mg via INTRAVENOUS

## 2017-04-17 MED ORDER — MORPHINE SULFATE (PF) 4 MG/ML IV SOLN
2.0000 mg | INTRAVENOUS | Status: DC | PRN
  Administered 2017-04-17 – 2017-04-20 (×3): 2 mg via INTRAVENOUS
  Administered 2017-04-20: 4 mg via INTRAVENOUS
  Filled 2017-04-17 (×4): qty 1

## 2017-04-17 MED ORDER — CEFAZOLIN SODIUM-DEXTROSE 1-4 GM/50ML-% IV SOLN
1.0000 g | Freq: Three times a day (TID) | INTRAVENOUS | Status: AC
  Administered 2017-04-17 (×2): 1 g via INTRAVENOUS
  Filled 2017-04-17 (×2): qty 50

## 2017-04-17 MED ORDER — SUGAMMADEX SODIUM 200 MG/2ML IV SOLN
INTRAVENOUS | Status: AC
  Filled 2017-04-17: qty 2

## 2017-04-17 MED ORDER — ACETAMINOPHEN 325 MG PO TABS
650.0000 mg | ORAL_TABLET | ORAL | Status: DC | PRN
  Administered 2017-04-18 (×2): 650 mg via ORAL
  Filled 2017-04-17 (×2): qty 2

## 2017-04-17 MED ORDER — EPHEDRINE SULFATE-NACL 50-0.9 MG/10ML-% IV SOSY
PREFILLED_SYRINGE | INTRAVENOUS | Status: DC | PRN
  Administered 2017-04-17 (×3): 10 mg via INTRAVENOUS

## 2017-04-17 MED ORDER — MIDAZOLAM HCL 2 MG/2ML IJ SOLN
INTRAMUSCULAR | Status: AC
  Filled 2017-04-17: qty 2

## 2017-04-17 MED ORDER — FENTANYL CITRATE (PF) 250 MCG/5ML IJ SOLN
INTRAMUSCULAR | Status: AC
  Filled 2017-04-17: qty 5

## 2017-04-17 MED ORDER — SUGAMMADEX SODIUM 200 MG/2ML IV SOLN
INTRAVENOUS | Status: DC | PRN
  Administered 2017-04-17: 200 mg via INTRAVENOUS

## 2017-04-17 MED ORDER — HYDROMORPHONE HCL 1 MG/ML IJ SOLN
INTRAMUSCULAR | Status: AC
  Filled 2017-04-17: qty 2

## 2017-04-17 MED ORDER — ALBUMIN HUMAN 5 % IV SOLN
INTRAVENOUS | Status: DC | PRN
  Administered 2017-04-17 (×2): via INTRAVENOUS

## 2017-04-17 MED ORDER — LIDOCAINE 2% (20 MG/ML) 5 ML SYRINGE
INTRAMUSCULAR | Status: DC | PRN
  Administered 2017-04-17: 100 mg via INTRAVENOUS

## 2017-04-17 MED ORDER — ALBUMIN HUMAN 5 % IV SOLN
INTRAVENOUS | Status: AC
  Filled 2017-04-17: qty 250

## 2017-04-17 MED ORDER — SCOPOLAMINE 1 MG/3DAYS TD PT72
MEDICATED_PATCH | TRANSDERMAL | Status: AC
  Filled 2017-04-17: qty 1

## 2017-04-17 MED ORDER — OXYCODONE HCL 5 MG PO TABS
ORAL_TABLET | ORAL | Status: AC
  Filled 2017-04-17: qty 1

## 2017-04-17 MED ORDER — BUPIVACAINE HCL (PF) 0.25 % IJ SOLN
INTRAMUSCULAR | Status: AC
  Filled 2017-04-17: qty 30

## 2017-04-17 MED ORDER — LIDOCAINE 2% (20 MG/ML) 5 ML SYRINGE
INTRAMUSCULAR | Status: AC
  Filled 2017-04-17: qty 5

## 2017-04-17 MED ORDER — PRAVASTATIN SODIUM 40 MG PO TABS
40.0000 mg | ORAL_TABLET | Freq: Every day | ORAL | Status: DC
  Administered 2017-04-18 – 2017-04-22 (×5): 40 mg via ORAL
  Filled 2017-04-17 (×5): qty 1

## 2017-04-17 MED ORDER — FENTANYL CITRATE (PF) 100 MCG/2ML IJ SOLN
INTRAMUSCULAR | Status: DC | PRN
  Administered 2017-04-17 (×3): 50 ug via INTRAVENOUS
  Administered 2017-04-17: 100 ug via INTRAVENOUS
  Administered 2017-04-17 (×2): 50 ug via INTRAVENOUS

## 2017-04-17 MED ORDER — HYDROMORPHONE HCL 1 MG/ML IJ SOLN
0.2500 mg | INTRAMUSCULAR | Status: DC | PRN
  Administered 2017-04-17 (×3): 0.5 mg via INTRAVENOUS

## 2017-04-17 MED ORDER — ROCURONIUM BROMIDE 10 MG/ML (PF) SYRINGE
PREFILLED_SYRINGE | INTRAVENOUS | Status: AC
  Filled 2017-04-17: qty 5

## 2017-04-17 MED ORDER — DEXAMETHASONE SODIUM PHOSPHATE 10 MG/ML IJ SOLN
INTRAMUSCULAR | Status: AC
  Filled 2017-04-17: qty 1

## 2017-04-17 MED ORDER — CEFAZOLIN SODIUM-DEXTROSE 2-4 GM/100ML-% IV SOLN
2.0000 g | INTRAVENOUS | Status: AC
  Administered 2017-04-17: 2 g via INTRAVENOUS
  Filled 2017-04-17: qty 100

## 2017-04-17 MED ORDER — OXYCODONE HCL 5 MG PO TABS
5.0000 mg | ORAL_TABLET | ORAL | Status: DC | PRN
  Administered 2017-04-17 – 2017-04-22 (×11): 5 mg via ORAL
  Filled 2017-04-17 (×11): qty 1

## 2017-04-17 MED ORDER — TRANEXAMIC ACID 1000 MG/10ML IV SOLN
1000.0000 mg | INTRAVENOUS | Status: AC
  Filled 2017-04-17: qty 10

## 2017-04-17 MED ORDER — FENTANYL CITRATE (PF) 100 MCG/2ML IJ SOLN
INTRAMUSCULAR | Status: AC
  Filled 2017-04-17: qty 2

## 2017-04-17 MED ORDER — PHENYLEPHRINE HCL 10 MG/ML IJ SOLN
INTRAMUSCULAR | Status: AC
  Filled 2017-04-17: qty 1

## 2017-04-17 MED ORDER — SODIUM CHLORIDE 0.9 % IV SOLN: Freq: Once | INTRAVENOUS | Status: DC

## 2017-04-17 MED ORDER — OXYBUTYNIN CHLORIDE 5 MG PO TABS
5.0000 mg | ORAL_TABLET | Freq: Three times a day (TID) | ORAL | Status: DC | PRN
  Administered 2017-04-18: 5 mg via ORAL
  Filled 2017-04-17: qty 1

## 2017-04-17 SURGICAL SUPPLY — 50 items
BAG URINE DRAINAGE (UROLOGICAL SUPPLIES) ×3 IMPLANT
BLADE EXTENDED COATED 6.5IN (ELECTRODE) ×3 IMPLANT
BLADE HEX COATED 2.75 (ELECTRODE) ×3 IMPLANT
CATH FOLEY 2WAY SLVR  5CC 22FR (CATHETERS) ×2
CATH FOLEY 2WAY SLVR 30CC 22FR (CATHETERS) ×3 IMPLANT
CATH FOLEY 2WAY SLVR 30CC 24FR (CATHETERS) ×3 IMPLANT
CATH FOLEY 2WAY SLVR 5CC 22FR (CATHETERS) ×1 IMPLANT
CATH MALECOT BARD  24FR (CATHETERS) ×2
CATH MALECOT BARD 24FR (CATHETERS) ×1 IMPLANT
CATH URET 5FR 28IN OPEN ENDED (CATHETERS) ×6 IMPLANT
CHLORAPREP W/TINT 26ML (MISCELLANEOUS) ×3 IMPLANT
CLIP VESOLOCK LG 6/CT PURPLE (CLIP) ×12 IMPLANT
CLIP VESOLOCK MED LG 6/CT (CLIP) ×12 IMPLANT
COVER SURGICAL LIGHT HANDLE (MISCELLANEOUS) ×3 IMPLANT
DRAIN CHANNEL 10F 3/8 F FF (DRAIN) IMPLANT
DRAPE LAPAROTOMY TRNSV 102X78 (DRAPE) ×3 IMPLANT
DRAPE WARM FLUID 44X44 (DRAPE) ×3 IMPLANT
DRSG OPSITE POSTOP 4X8 (GAUZE/BANDAGES/DRESSINGS) ×3 IMPLANT
ELECT NEEDLE TIP 2.8 STRL (NEEDLE) ×3 IMPLANT
ELECT REM PT RETURN 15FT ADLT (MISCELLANEOUS) ×3 IMPLANT
EVACUATOR SILICONE 100CC (DRAIN) ×3 IMPLANT
GAUZE SPONGE 4X4 16PLY XRAY LF (GAUZE/BANDAGES/DRESSINGS) ×3 IMPLANT
GLOVE BIOGEL M STRL SZ7.5 (GLOVE) ×3 IMPLANT
GOWN STRL REUS W/TWL LRG LVL3 (GOWN DISPOSABLE) ×9 IMPLANT
KIT BASIN OR (CUSTOM PROCEDURE TRAY) ×3 IMPLANT
NS IRRIG 1000ML POUR BTL (IV SOLUTION) ×6 IMPLANT
PACK GENERAL/GYN (CUSTOM PROCEDURE TRAY) ×3 IMPLANT
PACKING VAGINAL (PACKING) ×3 IMPLANT
PLUG CATH AND CAP STER (CATHETERS) ×3 IMPLANT
SET IRRIG Y TYPE TUR BLADDER L (SET/KITS/TRAYS/PACK) IMPLANT
SPONGE LAP 18X18 X RAY DECT (DISPOSABLE) ×3 IMPLANT
SPONGE LAP 4X18 X RAY DECT (DISPOSABLE) ×3 IMPLANT
STAPLER SKIN PROX WIDE 3.9 (STAPLE) IMPLANT
STAPLER VISISTAT 35W (STAPLE) IMPLANT
SUT CHROMIC 0 UR 5 27 (SUTURE) IMPLANT
SUT CHROMIC 2 0 UR 5 27 (SUTURE) IMPLANT
SUT CHROMIC 3 0 SH 27 (SUTURE) ×18 IMPLANT
SUT ETHILON 4 0 PS 2 18 (SUTURE) IMPLANT
SUT PDS AB 0 CTX 60 (SUTURE) ×6 IMPLANT
SUT PDS AB 1 CTX 36 (SUTURE) ×6 IMPLANT
SUT VIC AB 1 CTX 36 (SUTURE)
SUT VIC AB 1 CTX36XBRD ANBCTR (SUTURE) IMPLANT
SUT VIC AB 2-0 SH 27 (SUTURE) ×4
SUT VIC AB 2-0 SH 27X BRD (SUTURE) ×2 IMPLANT
SUT VIC AB 2-0 UR6 27 (SUTURE) ×6 IMPLANT
SYR 30ML LL (SYRINGE) ×3 IMPLANT
SYRINGE IRR TOOMEY STRL 70CC (SYRINGE) ×3 IMPLANT
TOWEL OR 17X26 10 PK STRL BLUE (TOWEL DISPOSABLE) ×6 IMPLANT
TUBE FEEDING 8FR 16IN STR KANG (MISCELLANEOUS) ×3 IMPLANT
WATER STERILE IRR 1000ML POUR (IV SOLUTION) ×3 IMPLANT

## 2017-04-17 NOTE — Anesthesia Procedure Notes (Signed)
Procedure Name: Intubation Date/Time: 04/17/2017 7:31 AM Performed by: Lind Covert, CRNA Pre-anesthesia Checklist: Patient identified, Emergency Drugs available, Suction available, Patient being monitored and Timeout performed Patient Re-evaluated:Patient Re-evaluated prior to induction Oxygen Delivery Method: Circle system utilized Preoxygenation: Pre-oxygenation with 100% oxygen Induction Type: IV induction Laryngoscope Size: Mac and 4 Grade View: Grade I Tube type: Oral Tube size: 7.5 mm Number of attempts: 1 Airway Equipment and Method: Stylet Placement Confirmation: ETT inserted through vocal cords under direct vision,  positive ETCO2 and breath sounds checked- equal and bilateral Secured at: 22 cm Tube secured with: Tape Dental Injury: Teeth and Oropharynx as per pre-operative assessment

## 2017-04-17 NOTE — Op Note (Signed)
Operative Note  Preoperative diagnosis:  1.  Benign prostatic hyperplasia with urinary retention  Postoperative diagnosis: 1.  Benign prostatic hyperplasia with urinary retention  Procedure(s): 1.  Cystoscopy with complex catheter placement over a wire  2.  Open suprapubic simple prostatectomy  Surgeon: Link Snuffer, MD  Assistants: Festus Aloe, MD-- an assistant was needed due to the complexity of the case and was negative for retraction, suctioning, assistance with suturing, etc.  Anesthesia: General  Complications: None immediate  EBL: 400 cc  Specimens: 1.  Prostate adenoma  Drains/Catheters: 1.  38 French urethral catheter with 30 cc into the catheter balloon 2.  24 French Malecot catheter as a suprapubic tube 3.  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  Catheter placement required cystoscopic guidance.  He has had to have the catheter replaced over a wire in the office.  He was found to have a elevated PSA and underwent a transrectal ultrasound-guided biopsy that revealed a 400 g prostate.  Biopsy revealed one core of atypia and therefore he underwent an MRI of the prostate.  This revealed a sizing of a little over 200 cc but no evidence of any malignancy.  After discussion of different options, the patient elected to undergo the above operation.  Description of procedure:  The patient was identified and consent was obtained.  The patient was taken to the operating room and placed in the supine position.  The patient was placed under general anesthesia.  Perioperative antibiotics were administered.  The patient was placed in supine position and the table was flexed.  Patient was prepped and draped in a standard sterile fashion and a timeout was performed.  First, a 23 French flexible cystoscope was advanced into the  urethra and navigated through the prostate and into the bladder.  A wire was advanced through the scope and the scope was withdrawn.  A 16 [place] council tip catheter was advanced over the wire.  The catheter was capped.  A lower midline incision was made from the pubic bone to just below the umbilicus.  This was carried down with Bovie electrocautery and the anterior rectus sheath was entered and divided.  The midline of the musculature was identified and bluntly separated.  The posterior sheath was divided staying in the retroperitoneal space.  The retroperitoneal space was bluntly developed.  A Balfour retractor was placed for retraction.  The bladder was filled with 200 cc of normal saline.  2 stay sutures of 3-0 chromic were placed into the bladder.  The bladder was then divided anteriorly/longitudinally with electrocautery.  The bladder incision was carried down to close to the bladder neck.  Bilateral ureteral orifices were identified and a wire was advanced into each 1 of them and a 5 French catheter was advanced over each wire up the ureter to aid in identification of the ureter throughout the case.  We then used a needle tip Bovie to carefully score around the prostate adenoma taking care to stay away from the ureteral orifices.  This was carried around the bladder neck.  The anterior portion of the prostate adenoma was fractured and the adenoma was bluntly dissected from the fossa and removed en bloc.  Was passed off for specimen.  Vaginal packing was packed into the prostatic fossa for hemostasis and this remained for about 5 minutes.  Vaginal packing was removed.  There was minimal  bleeding noted from the prostatic fossa.  A running 3-0 chromic suture was used to reapproximate the bladder neck posteriorly and for hemostatic purposes.  Care was taken to stay away from the ureteral orifices during this running hemostatic suture closure.  We again inspected the prostatic fossa and there was no  significant bleeding noted.  A 22 French catheter was advanced into the bladder.  It easily passed into the bladder.  A small incision was made right lateral to the lower midline incision and a 24 French Malecot catheter was brought through this incision and into the abdomen.  It a small incision was made into the bladder and this was advanced into the bladder and then secured down with a 3-0 chromic pursestring suture.  30 cc was placed into the urethral catheter balloon.  This situated nicely into the prostatic fossa for hemostatic purposes.  I then proceeded with closure of the bladder.  I closed in 2 layers first with a running mucosal 3-0 chromic suture followed by a detrusor layer of running 2-0 Vicryl.  The closure was tested by placing 150 cc of sterile water into the bladder and there was no significant leak.  The catheter was continuously irrigated to prevent clot formation.  A JP drain was placed that came out the left side of the lower abdominal incision.  Lower abdominal incision was closed first closing the fascia with running 0 looped PDS suture and the skin was closed with staples.  The suprapubic catheter and the drain were secured down with drain stitches.  Dressings were applied.  Continuous bladder irrigation was started.  The inflow was via the urethral catheter and the outflow via the suprapubic catheter.  This concluded the operation.  The patient tolerated the procedure well and was stable postoperatively.  Plan: Obtain stat H&H and continue to follow this.  He will be weaned off continuous bladder irrigation as able.

## 2017-04-17 NOTE — Transfer of Care (Signed)
Immediate Anesthesia Transfer of Care Note  Patient: Dan Mathis  Procedure(s) Performed: OPEN SIMPLE PROSTATECTOMY (N/A )  Patient Location: PACU  Anesthesia Type:General  Level of Consciousness: sedated  Airway & Oxygen Therapy: Patient Spontanous Breathing and Patient connected to face mask oxygen  Post-op Assessment: Report given to RN and Post -op Vital signs reviewed and stable  Post vital signs: Reviewed and stable  Last Vitals:  Vitals:   04/17/17 0557  BP: 97/83  Pulse: 79  Resp: 18  Temp: 36.7 C  SpO2: 100%    Last Pain:  Vitals:   04/17/17 0557  TempSrc: Oral         Complications: No apparent anesthesia complications

## 2017-04-18 LAB — HEMOGLOBIN AND HEMATOCRIT, BLOOD
HCT: 24.4 % — ABNORMAL LOW (ref 39.0–52.0)
HEMATOCRIT: 18.8 % — AB (ref 39.0–52.0)
HEMOGLOBIN: 6.3 g/dL — AB (ref 13.0–17.0)
Hemoglobin: 8.2 g/dL — ABNORMAL LOW (ref 13.0–17.0)

## 2017-04-18 LAB — BASIC METABOLIC PANEL
Anion gap: 8 (ref 5–15)
BUN: 17 mg/dL (ref 6–20)
CALCIUM: 7.6 mg/dL — AB (ref 8.9–10.3)
CO2: 19 mmol/L — AB (ref 22–32)
Chloride: 113 mmol/L — ABNORMAL HIGH (ref 101–111)
Creatinine, Ser: 1.38 mg/dL — ABNORMAL HIGH (ref 0.61–1.24)
GFR calc non Af Amer: 51 mL/min — ABNORMAL LOW (ref >60)
GFR, EST AFRICAN AMERICAN: 59 mL/min — AB (ref >60)
Glucose, Bld: 117 mg/dL — ABNORMAL HIGH (ref 65–99)
Potassium: 4.2 mmol/L (ref 3.5–5.1)
SODIUM: 140 mmol/L (ref 135–145)

## 2017-04-18 MED ORDER — SODIUM CHLORIDE 0.9 % IV SOLN
Freq: Once | INTRAVENOUS | Status: AC
  Administered 2017-04-18: 09:00:00 via INTRAVENOUS

## 2017-04-18 NOTE — Progress Notes (Signed)
Pt temp elevated, MD made aware, OK to continue transfusion. ENC pt to cough and deep breathe. Will cont to monitor closely. SRP, RN

## 2017-04-18 NOTE — Progress Notes (Signed)
CRITICAL VALUE ALERT  Critical Value:  Hemoglobin-6.3  Date & Time Notied:  04/18/17 0626  Provider Notified: Dr. Louis Meckel  Orders Received/Actions taken: Dr Louis Meckel stated he informed Dr. Gloriann Loan already and Dr. Gloriann Loan will follow up.

## 2017-04-18 NOTE — Anesthesia Postprocedure Evaluation (Signed)
Anesthesia Post Note  Patient: Dan Mathis  Procedure(s) Performed: OPEN SIMPLE PROSTATECTOMY (N/A )     Patient location during evaluation: PACU Anesthesia Type: General Level of consciousness: awake and alert Pain management: pain level controlled Vital Signs Assessment: post-procedure vital signs reviewed and stable Respiratory status: spontaneous breathing, nonlabored ventilation, respiratory function stable and patient connected to nasal cannula oxygen Cardiovascular status: blood pressure returned to baseline and stable Postop Assessment: no apparent nausea or vomiting Anesthetic complications: no                   Aldine Grainger A.

## 2017-04-18 NOTE — Progress Notes (Addendum)
Urology Inpatient Progress Report  BENIGN PROSTATIC HYPERPLASIA WIHT URINARY RETERNTION  Procedure(s): OPEN SIMPLE PROSTATECTOMY  1 Day Post-Op   Intv/Subj: No acute events overnight. Patient is without complaint except some pain that has been controlled with p.o. pain medication.  Hemoglobin is 6.2 today.  Urine is a light pink on very light drip of CBI.  Active Problems:   BPH (benign prostatic hyperplasia)  Current Facility-Administered Medications  Medication Dose Route Frequency Provider Last Rate Last Dose  . 0.9 %  sodium chloride infusion   Intravenous Continuous Marton Redwood III, MD 125 mL/hr at 04/18/17 [phone]    . 0.9 %  sodium chloride infusion   Intravenous Once Marton Redwood III, MD      . acetaminophen (TYLENOL) tablet 650 mg  650 mg Oral Q4H PRN Marton Redwood III, MD      . diphenhydrAMINE (BENADRYL) injection 12.5 mg  12.5 mg Intravenous Q6H PRN Marton Redwood III, MD       Or  . diphenhydrAMINE (BENADRYL) 12.5 MG/5ML elixir 12.5 mg  12.5 mg Oral Q6H PRN Marton Redwood III, MD      . hydrALAZINE (APRESOLINE) injection 10 mg  10 mg Intravenous Q8H PRN Marton Redwood III, MD      . lip balm (CARMEX) ointment   Topical PRN Marton Redwood III, MD      . morphine 4 MG/ML injection 2-4 mg  2-4 mg Intravenous Q2H PRN Marton Redwood III, MD   2 mg at 04/17/17 1956  . neomycin-bacitracin-polymyxin (NEOSPORIN) ointment 1 application  1 application Topical TID PRN Marton Redwood III, MD      . ondansetron [facility]) injection 4 mg  4 mg Intravenous Q4H PRN Marton Redwood III, MD      . opium-belladonna (B&O SUPPRETTES) 16.2-60 MG suppository 1 suppository  1 suppository Rectal Q6H PRN Marton Redwood III, MD   1 suppository at 04/17/17 1057  . oxybutynin (DITROPAN) tablet 5 mg  5 mg Oral Q8H PRN Marton Redwood III, MD      . oxyCODONE (Oxy IR/ROXICODONE) immediate release tablet 5 mg  5 mg Oral Q4H PRN Marton Redwood III, MD   5 mg at 04/17/17 1444  . pravastatin (PRAVACHOL)  tablet 40 mg  40 mg Oral Daily Marton Redwood III, MD      . senna-docusate (Senokot-S) tablet 2 tablet  2 tablet Oral QHS Marton Redwood III, MD   2 tablet at 04/17/17 2236  . sodium chloride irrigation 0.9 % 3,000 mL  3,000 mL Irrigation Continuous Marton Redwood III, MD   3,000 mL at 04/17/17 1709     Objective: Vital: Vitals:   04/17/17 1534 04/17/17 2122 04/18/17 0135 04/18/17 0508  BP: 112/73 (!) 115/58 (!) 106/50 (!) 111/49  Pulse: 78 80 94 98  Resp: 18 18 16 16   Temp: 98.1 F (36.7 C) 97.8 F (36.6 C) 98 F (36.7 C) 98.5 F (36.9 C)  TempSrc:  Oral Oral Oral  SpO2: 100% 100% 100% 100%  Weight:      Height:       I/Os: I/O last 3 completed shifts: In: 11831.3 [P.O.:480; I.V.:3331.3; Other:7170; IV Piggyback:850] Out: 8730 [OYDXA:1287; Drains:1255; Blood:400]  Physical Exam:  General: Patient is in no apparent distress Lungs: Normal respiratory effort, chest expands symmetrically. GI: Incisions covered with a dressing that is clean dry and intact.  The abdomen is soft and appropriately tender without mass. JP  drain with serosanguinous drainage consistent with some irrigant that has leaked from the closure with CBI on Foley: Suprapubic catheter in place draining light pink urine on a light drip of CBI.  Foley catheter in place.  CBI is going into the urethral catheter and out the suprapubic catheter. Ext: lower extremities symmetric  Lab Results: Recent Labs    04/17/17 1110 04/17/17 1758 04/18/17 0437  HGB 8.0* 7.5* 6.3*  HCT 24.1* 22.8* 18.8*   Recent Labs    04/17/17 1110 04/18/17 0437  NA 141 140  K 3.3* 4.2  CL 109 113*  CO2 23 19*  GLUCOSE 209* 117*  BUN 16 17  CREATININE 1.32* 1.38*  CALCIUM 8.1* 7.6*   No results for input(s): LABPT, INR in the last 72 hours. No results for input(s): LABURIN in the last 72 hours. Results for orders placed or performed during the hospital encounter of 12/21/16  Urine culture     Status: Abnormal   Collection  Time: 12/21/16  3:07 AM  Result Value Ref Range Status   Specimen Description URINE, CATHETERIZED  Final   Special Requests Normal  Final   Culture (A)  Final    <10,000 COLONIES/mL Performed at [facility], [place] [address] [place], [place] [address]    Report Status 12/22/2016 FINAL  Final    Studies/Results: No results found.  Assessment: Acute blood loss anemia BPH with urinary retention  Procedure(s): OPEN SIMPLE PROSTATECTOMY, 1 Day Post-Op  doing well.  Plan: Transfused 2 units of PRBC Saline lock IV Transition to general diet Take off bedrest, activity as tolerated, ambulate halls today Continue JP drain.  Continue to wean CBI. SCDs for DVT prophylaxis.  Avoid Lovenox given the risk of postoperative bleeding    Link Snuffer, MD Urology 04/18/2017, 7:33 AM

## 2017-04-19 LAB — TYPE AND SCREEN
ABO/RH(D): AB POS
Antibody Screen: NEGATIVE
UNIT DIVISION: 0
UNIT DIVISION: 0
Unit division: 0
Unit division: 0

## 2017-04-19 LAB — BPAM RBC
BLOOD PRODUCT EXPIRATION DATE: 201903282359
BLOOD PRODUCT EXPIRATION DATE: 201903282359
BLOOD PRODUCT EXPIRATION DATE: 201903312359
Blood Product Expiration Date: 201903312359
ISSUE DATE / TIME: 201903131440
ISSUE DATE / TIME: 201903131654
ISSUE DATE / TIME: 201903131140
UNIT TYPE AND RH: 6200
UNIT TYPE AND RH: 6200
UNIT TYPE AND RH: 6200
Unit Type and Rh: 6200

## 2017-04-19 LAB — HEMOGLOBIN AND HEMATOCRIT, BLOOD
HEMATOCRIT: 23.7 % — AB (ref 39.0–52.0)
Hemoglobin: 8.1 g/dL — ABNORMAL LOW (ref 13.0–17.0)

## 2017-04-19 MED ORDER — OXYBUTYNIN CHLORIDE 5 MG PO TABS
5.0000 mg | ORAL_TABLET | Freq: Three times a day (TID) | ORAL | Status: DC
  Administered 2017-04-19 – 2017-04-22 (×9): 5 mg via ORAL
  Filled 2017-04-19 (×9): qty 1

## 2017-04-19 MED ORDER — BELLADONNA ALKALOIDS-OPIUM 16.2-60 MG RE SUPP
1.0000 | Freq: Once | RECTAL | Status: AC
  Administered 2017-04-19: 1 via RECTAL
  Filled 2017-04-19: qty 1

## 2017-04-19 NOTE — Progress Notes (Signed)
Urology Inpatient Progress Report  BENIGN PROSTATIC HYPERPLASIA WIHT URINARY RETERNTION  Procedure(s): OPEN SIMPLE PROSTATECTOMY  2 Days Post-Op   Intv/Subj: No acute events overnight. Patient is without complaint except some pain around the incision.JP drain continues to put out a moderate amount but this is on continuous bladder irrigation.  Hemoglobin stable.  Required 2 units of blood yesterday.  Urine is light red and thin this morning off CBI.  Pathology revealed BPH, no malignancy  Active Problems:   BPH (benign prostatic hyperplasia)  Current Facility-Administered Medications  Medication Dose Route Frequency Provider Last Rate Last Dose  . acetaminophen (TYLENOL) tablet 650 mg  650 mg Oral Q4H PRN Marton Redwood III, MD   650 mg at 04/18/17 1155  . diphenhydrAMINE (BENADRYL) injection 12.5 mg  12.5 mg Intravenous Q6H PRN Marton Redwood III, MD       Or  . diphenhydrAMINE (BENADRYL) 12.5 MG/5ML elixir 12.5 mg  12.5 mg Oral Q6H PRN Marton Redwood III, MD      . hydrALAZINE (APRESOLINE) injection 10 mg  10 mg Intravenous Q8H PRN Marton Redwood III, MD      . lip balm (CARMEX) ointment   Topical PRN Marton Redwood III, MD      . morphine 4 MG/ML injection 2-4 mg  2-4 mg Intravenous Q2H PRN Marton Redwood III, MD   2 mg at 04/17/17 1956  . neomycin-bacitracin-polymyxin (NEOSPORIN) ointment 1 application  1 application Topical TID PRN Marton Redwood III, MD      . ondansetron [facility]) injection 4 mg  4 mg Intravenous Q4H PRN Marton Redwood III, MD      . opium-belladonna (B&O SUPPRETTES) 16.2-60 MG suppository 1 suppository  1 suppository Rectal Q6H PRN Marton Redwood III, MD   1 suppository at 04/17/17 1057  . oxybutynin (DITROPAN) tablet 5 mg  5 mg Oral Q8H PRN Marton Redwood III, MD   5 mg at 04/18/17 0904  . oxyCODONE (Oxy IR/ROXICODONE) immediate release tablet 5 mg  5 mg Oral Q4H PRN Marton Redwood III, MD   5 mg at 04/19/17 0746  . pravastatin (PRAVACHOL) tablet 40 mg  40 mg  Oral Daily Marton Redwood III, MD   40 mg at 04/19/17 1127  . senna-docusate (Senokot-S) tablet 2 tablet  2 tablet Oral QHS Marton Redwood III, MD   2 tablet at 04/18/17 2124  . sodium chloride irrigation 0.9 % 3,000 mL  3,000 mL Irrigation Continuous Marton Redwood III, MD   3,000 mL at 04/18/17 2130     Objective: Vital: Vitals:   04/18/17 1739 04/18/17 2116 04/19/17 0100 04/19/17 0600  BP: 117/67 (!) 96/52 135/66 128/62  Pulse: 87 95 95 (!) 103  Resp: 18 18 18 16   Temp: 99.7 F (37.6 C) 99.6 F (37.6 C) 99.5 F (37.5 C) 98.4 F (36.9 C)  TempSrc: Oral Oral Oral Oral  SpO2: 92% 93% 91% 91%  Weight:      Height:       I/Os: I/O last 3 completed shifts: In: 38182 [I.V.:1250; Blood:622; XHBZJ:6967; IV Piggyback:50] Out: 89381 [Urine:8100; Drains:3920]  Physical Exam:  General: Patient is in no apparent distress Lungs: Normal respiratory effort, chest expands symmetrically. GI: Incisions are c/d/i with staples in place. The abdomen is soft and appropriatelytender without mass. JP drain with serosanguinous drainage, consistent with some leakage of irrigant Foley: 22 French urethral catheter in place.  30 French Malecot suprapubic catheter in  place which is draining light red and thin urine Ext: lower extremities symmetric  Lab Results: Recent Labs    04/18/17 0437 04/18/17 1928 04/19/17 0418  HGB 6.3* 8.2* 8.1*  HCT 18.8* 24.4* 23.7*   Recent Labs    04/17/17 1110 04/18/17 0437  NA 141 140  K 3.3* 4.2  CL 109 113*  CO2 23 19*  GLUCOSE 209* 117*  BUN 16 17  CREATININE 1.32* 1.38*  CALCIUM 8.1* 7.6*   No results for input(s): LABPT, INR in the last 72 hours. No results for input(s): LABURIN in the last 72 hours. Results for orders placed or performed during the hospital encounter of 12/21/16  Urine culture     Status: Abnormal   Collection Time: 12/21/16  3:07 AM  Result Value Ref Range Status   Specimen Description URINE, CATHETERIZED  Final   Special  Requests Normal  Final   Culture (A)  Final    <10,000 COLONIES/mL Performed at [facility], [place] [address], [place], [place] [address]    Report Status 12/22/2016 FINAL  Final    Studies/Results: No results found.  Assessment: Acute blood loss anemia: Status post 2 units PRBC, hemoglobin stable  BPH with urinary retention  Procedure(s): OPEN SIMPLE PROSTATECTOMY, 2 Days Post-Op  doing well.  Plan: General diet Saline lock IV Out of bed and ambulate today SCDs for DVT prophylaxis.  No Lovenox due to risk of bleeding Discontinue CBI for now and see how he does moving around Continue JP drain and monitor output now the CBI is off   Link Snuffer, MD Urology 04/19/2017, 12:23 PM

## 2017-04-20 LAB — HEMOGLOBIN AND HEMATOCRIT, BLOOD
HCT: 24.1 % — ABNORMAL LOW (ref 39.0–52.0)
HEMOGLOBIN: 8.1 g/dL — AB (ref 13.0–17.0)

## 2017-04-20 MED ORDER — OXYBUTYNIN CHLORIDE 5 MG PO TABS: 5.0000 mg | ORAL_TABLET | Freq: Three times a day (TID) | ORAL | 2 refills | Status: AC

## 2017-04-20 MED ORDER — OXYCODONE HCL 5 MG PO TABS: 5.0000 mg | ORAL_TABLET | ORAL | 0 refills | Status: AC | PRN

## 2017-04-20 NOTE — Discharge Instructions (Signed)
·   Keep the suprapubic catheter capped (the one coming out of the belly) and the urethral catheter (the one in the penis) to drainage. If it stops draining, then you may switch the drainage to the suprapubic catheter   Take the oxybutynin scheduled every 8 hours to help with bladder spasms and help the bladder heal.   Activity:  You are encouraged to ambulate frequently (about every hour during waking hours) to help prevent blood clots from forming in your legs or lungs.  However, you should not engage in any heavy lifting (> 10-15 lbs), strenuous activity, or straining.   Diet: You should advance your diet as instructed by your physician.  It will be normal to have some bloating, nausea, and abdominal discomfort intermittently.   Prescriptions:  You will be provided a prescription for pain medication to take as needed.  If your pain is not severe enough to require the prescription pain medication, you may take extra strength Tylenol instead which will have less side effects.  You should also take a prescribed stool softener to avoid straining with bowel movements as the prescription pain medication may constipate you.   Incisions: You may remove your dressing bandages 48 hours after surgery if not removed in the hospital.  You will either have some small staples or special tissue glue at each of the incision sites. Once the bandages are removed (if present), the incisions may stay open to air.  You may start showering (but not soaking or bathing in water) the 2nd day after surgery and the incisions simply need to be patted dry after the shower.  No additional care is needed.   What to call us about: You should call the office [phone]) if you develop fever > 101 or develop persistent vomiting. Activity:  You are encouraged to ambulate frequently (about every hour during waking hours) to help prevent blood clots from forming in your legs or lungs.  However, you should not engage in any heavy  lifting (> 10-15 lbs), strenuous activity, or straining. '

## 2017-04-20 NOTE — Progress Notes (Signed)
Patient stated that foley catheter seemed to leaking around the insertion site. Towel in-between patient's legs was moderately saturated with pink tinged fluid. PRN order to hand irrigate suprapubic and foley catheters. Suprapubic catheter irrigated first per order then foley catheter. Foley catheter not draining, therefore a foley bag was connected to the suprapubic catheter. 159mL red urine drained from suprapubic catheter. 120mL red urine emptied from foley bag. Urology paged at [place]. Dr. Jess Barters called back at [phone]; made aware of the situation. Instructed to continue to hand irrigate. OK to leave foley bag connected to suprapubic catheter. No new orders. Will continue to monitor.

## 2017-04-20 NOTE — Progress Notes (Signed)
Urology Inpatient Progress Report  BENIGN PROSTATIC HYPERPLASIA WIHT URINARY RETERNTION  Procedure(s): OPEN SIMPLE PROSTATECTOMY  3 Days Post-Op   Intv/Subj: No acute events overnight.  Foley catheter did have difficulty draining through the urethral catheter and now there is a bag connected to both but the Foley catheter in the urethra is draining well this morning.  Drain was taken off suction last night. Patient is without complaint.  On my assessment, the patient is having multiple bladder spasms especially with irrigation of the catheter.  Active Problems:   BPH (benign prostatic hyperplasia)  Current Facility-Administered Medications  Medication Dose Route Frequency Provider Last Rate Last Dose  . acetaminophen (TYLENOL) tablet 650 mg  650 mg Oral Q4H PRN Marton Redwood III, MD   650 mg at 04/18/17 1155  . diphenhydrAMINE (BENADRYL) injection 12.5 mg  12.5 mg Intravenous Q6H PRN Marton Redwood III, MD       Or  . diphenhydrAMINE (BENADRYL) 12.5 MG/5ML elixir 12.5 mg  12.5 mg Oral Q6H PRN Marton Redwood III, MD      . hydrALAZINE (APRESOLINE) injection 10 mg  10 mg Intravenous Q8H PRN Marton Redwood III, MD      . lip balm (CARMEX) ointment   Topical PRN Marton Redwood III, MD      . morphine 4 MG/ML injection 2-4 mg  2-4 mg Intravenous Q2H PRN Marton Redwood III, MD   2 mg at 04/17/17 1956  . neomycin-bacitracin-polymyxin (NEOSPORIN) ointment 1 application  1 application Topical TID PRN Marton Redwood III, MD      . ondansetron [facility]) injection 4 mg  4 mg Intravenous Q4H PRN Marton Redwood III, MD      . oxybutynin (DITROPAN) tablet 5 mg  5 mg Oral TID Marton Redwood III, MD   5 mg at 04/19/17 2138  . oxyCODONE (Oxy IR/ROXICODONE) immediate release tablet 5 mg  5 mg Oral Q4H PRN Marton Redwood III, MD   5 mg at 04/20/17 0201  . pravastatin (PRAVACHOL) tablet 40 mg  40 mg Oral Daily Marton Redwood III, MD   40 mg at 04/19/17 1127  . senna-docusate (Senokot-S) tablet 2 tablet  2  tablet Oral QHS Marton Redwood III, MD   2 tablet at 04/19/17 2138  . sodium chloride irrigation 0.9 % 3,000 mL  3,000 mL Irrigation Continuous Marton Redwood III, MD   3,000 mL at 04/18/17 2130     Objective: Vital: Vitals:   04/19/17 1335 04/19/17 1352 04/19/17 2204 04/20/17 0543  BP:  138/70 124/66 120/69  Pulse: 97 99 99 92  Resp: (!) 28 20 20 20   Temp: (!) 100.6 F (38.1 C) 100.2 F (37.9 C) 100.3 F (37.9 C) 98.6 F (37 C)  TempSrc: Oral Oral Oral Oral  SpO2: 96% 91% 92% 92%  Weight:      Height:       I/Os: I/O last 3 completed shifts: In: Pierz: 7690 [NKNLZ:7673; Drains:3565]  Physical Exam:  General: Patient is in no apparent distress Lungs: Normal respiratory effort, chest expands symmetrically. GI: Incisions are c/d/i.The abdomen is soft and appropriately tender without mass. JP drain with serosanguinous drainage Foley: Urethral catheter in place draining clear urine.  Suprapubic catheter in place draining light blood-tinged urine. Ext: lower extremities symmetric  Lab Results: Recent Labs    04/18/17 1928 04/19/17 0418 04/20/17 0424  HGB 8.2* 8.1* 8.1*  HCT 24.4* 23.7* 24.1*   Recent  Labs    04/17/17 1110 04/18/17 0437  NA 141 140  K 3.3* 4.2  CL 109 113*  CO2 23 19*  GLUCOSE 209* 117*  BUN 16 17  CREATININE 1.32* 1.38*  CALCIUM 8.1* 7.6*   No results for input(s): LABPT, INR in the last 72 hours. No results for input(s): LABURIN in the last 72 hours. Results for orders placed or performed during the hospital encounter of 12/21/16  Urine culture     Status: Abnormal   Collection Time: 12/21/16  3:07 AM  Result Value Ref Range Status   Specimen Description URINE, CATHETERIZED  Final   Special Requests Normal  Final   Culture (A)  Final    <10,000 COLONIES/mL Performed at [facility], [place] [address], [place], [place] [address]    Report Status 12/22/2016 FINAL  Final    Studies/Results: No results  found.  Assessment: BPH with lower urinary tract symptoms and urinary retention Acute blood loss anemia-- resolved   Procedure(s): OPEN SIMPLE PROSTATECTOMY, 3 Days Post-Op  doing well.  Plan: General diet Saline lock IV Out of bed and ambulate today SCDs for DVT prophylaxis.  No Lovenox due to risk of bleeding Social work consult for home health needs Continue JP drain and monitor output now the CBI is off.  Keep off suction Hemoglobin stable.  No more labs. Anticipate discharge this afternoon or in the morning.     Link Snuffer, MD Urology 04/20/2017, 8:07 AM

## 2017-04-20 NOTE — Progress Notes (Signed)
Pt will discharge home with [facility] at discharge. In house rep aware.

## 2017-04-21 NOTE — Progress Notes (Signed)
Patient ID: Dan Mathis, male   DOB: April 01, 1950, 67 y.o.   MRN: [id number]  4 Days Post-Op Subjective: Pt with obstruction of his urethral catheter yesterday and increased drain output.  His catheter was repositioned by Dr. Gloriann Loan with improved urethral catheter output and decreased drain output.  No complaints this morning.  Objective: Vital signs in last 24 hours: Temp:  [99.1 F (37.3 C)-99.7 F (37.6 C)] 99.7 F (37.6 C) (03/16 0615) Pulse Rate:  [77-85] 77 (03/16 0615) Resp:  [20] 20 (03/16 0615) BP: (117-130)/(59-73) 117/73 (03/16 0615) SpO2:  [93 %-95 %] 95 % (03/16 0615)  Intake/Output from previous day: 03/15 0701 - 03/16 0700 In: 800 [P.O.:600] Out: 2175 [Urine:1475; Drains:700] Intake/Output this shift: Total I/O In: 240 [P.O.:240] Out: -   Physical Exam:  General: Alert and oriented CV: RRR Lungs: Clear Abdomen: Soft, ND, SP tube in placed and capped, drain with minimal serosanguinous drainage Incisions: C/D/I GU: Urethral catheter with light red urine, draining well Ext: NT, No erythema  Lab Results: Recent Labs    04/18/17 1928 04/19/17 0418 04/20/17 0424  HGB 8.2* 8.1* 8.1*  HCT 24.4* 23.7* 24.1*   BMET   Studies/Results: No results found.  Assessment/Plan: POD # 4 s/p open simple prostatectomy - Drain output decreased with improved UOP from urethral catheter after manipulation yesterday. - Continue urethral catheter and monitor drain output today - Hope to be able to remove drain and d/c home tomorrow with [facility]   LOS: 4 days   Khristie Sak,LES 04/21/2017, 10:09 AM

## 2017-04-22 NOTE — Care Management Note (Signed)
Case Management Note  Patient Details  Name: Dan Mathis MRN: [id number] Date of Birth: 04-Jul-1950  Subjective/Objective:   s/p open simple prostatectomy    Action/Plan: Please see previous NCM notes. NCM spoke pt and made aware [facility] will call to arrange visit. Contacted [facility] to make aware of scheduled dc home today.   Expected Discharge Date:  04/22/17               Expected Discharge Plan:  [place]  In-House Referral:  NA  Discharge planning Services  CM Consult  Post Acute Care Choice:  Home Health Choice offered to:  Patient  DME Arranged:  N/A DME Agency:  NA  HH Arranged:  RN [place] Agency:  [facility]  Status of Service:  Completed, signed off  If discussed at [place] of Stay Meetings, dates discussed:    Additional Comments:  Erenest Rasher, RN 04/22/2017, 10:26 AM

## 2017-04-22 NOTE — Discharge Summary (Signed)
Date of admission: 04/17/2017  Date of discharge: 04/22/2017  Admission diagnosis: Urinary retention  Discharge diagnosis:  Urinary retention  History and Physical: For full details, please see admission history and physical. Briefly, Dan Mathis is a 67 y.o. year old patient with urinary retention and an extremely large prostate.   Hospital Course:  Dan Mathis underwent an open simple prostatectomy on 04/17/2017.  He did require blood transfusion and was managed postoperatively with both a suprapubic tube and indwelling catheter with continuous bladder irrigation.  His continuous bladder irrigation was able to be gradually stopped.  On 04/20/2017, he did have increased pelvic drain output when his urethral catheter became obstructed.  This was manipulated back into his bladder and subsequently drained quite well with a correlating decrease in pelvic drain output.  By the morning of 04/22/2017, he was doing quite well.  Home health nursing care was arranged.  His drain output had decreased to 10 cc per shift and was removed.  He was therefore felt stable for discharge home with his indwelling urethral catheter to straight drainage and his suprapubic tube capped.  Laboratory values:  Recent Labs    04/20/17 0424  HGB 8.1*  HCT 24.1*   No results for input(s): CREATININE in the last 72 hours.  Disposition: Home  Discharge instruction: The patient was instructed to be ambulatory but told to refrain from heavy lifting, strenuous activity, or driving.   Discharge medications:  Allergies as of 04/22/2017   No Known Allergies     Medication List    STOP taking these medications   cephALEXin 500 MG capsule Commonly known as:  KEFLEX   tamsulosin 0.4 MG Caps capsule Commonly known as:  FLOMAX     TAKE these medications   acetaminophen 500 MG tablet Commonly known as:  TYLENOL Take 1,000 mg by mouth every 6 (six) hours as needed for mild pain.   oxybutynin 5 MG tablet Commonly  known as:  DITROPAN Take 1 tablet (5 mg total) by mouth 3 (three) times daily.   oxyCODONE 5 MG immediate release tablet Commonly known as:  Oxy IR/ROXICODONE Take 1 tablet (5 mg total) by mouth every 4 (four) hours as needed for moderate pain.   pravastatin 40 MG tablet Commonly known as:  PRAVACHOL Take 1 tablet (40 mg total) by mouth daily.       Followup:  Follow-up Information    [facility] Follow up.   Specialty:  Home Health Services Why:  Will follow up with you 24 to 48 hours after discharge.  Contact information: [address] [place] [address] [phone]        Lucas Mallow, MD Follow up.   Specialty:  Urology Why:  05/02/17 at 2:15 PM Contact information: [place] [address] [phone]

## 2017-04-22 NOTE — Progress Notes (Signed)
Patient ID: Dan Mathis, male   DOB: [birth year], 67 y.o.   MRN: [id number]  5 Days Post-Op Subjective: Pt with uneventful last 24 hrs.  Drain with minimal output (10 cc per shift) and UOP adequate (not recorded during yesterday day shift but appropriate overnight last 2 shifts. No other complaints today.  Objective: Vital signs in last 24 hours: Temp:  [98.5 F (36.9 C)-99 F (37.2 C)] 98.8 F (37.1 C) (03/17 0500) Pulse Rate:  [74-78] 74 (03/17 0500) Resp:  [16-18] 16 (03/17 0500) BP: (98-106)/(56-61) 106/57 (03/17 0500) SpO2:  [99 %] 99 % (03/17 0500)  Intake/Output from previous day: 03/16 0701 - 03/17 0700 In: 480 [P.O.:480] Out: 485 [Urine:450; Drains:35] Intake/Output this shift: No intake/output data recorded.  Physical Exam:  General: Alert and oriented CV: RRR Lungs: Clear Abdomen: Soft, ND, Sp tube in place, Drain with minimal serosanguinous fluid Incisions: Healing well Ext: NT, No erythema  Lab Results: Recent Labs    04/20/17 0424  HGB 8.1*  HCT 24.1*   BMET   Studies/Results: No results found.  Assessment/Plan: POD #5 s/p open simple prostatectomy - D/C drain - D/C home with [facility] care if this is arranged    LOS: 5 days   Ragan Duhon,LES 04/22/2017, 8:57 AM

## 2017-04-22 NOTE — Progress Notes (Signed)
JP drain discontinued as ordered, sterile 4 x 4 dressing applied pt tol well. Teaching on wound care done, pt acknowledged understanding. Dressing supplies sent with patient.   SRP, RN

## 2017-04-24 DIAGNOSIS — Z435 Encounter for attention to cystostomy: Secondary | ICD-10-CM | POA: Diagnosis not present

## 2017-04-24 DIAGNOSIS — N183 Chronic kidney disease, stage 3 (moderate): Secondary | ICD-10-CM | POA: Diagnosis not present

## 2017-04-24 DIAGNOSIS — N401 Enlarged prostate with lower urinary tract symptoms: Secondary | ICD-10-CM | POA: Diagnosis not present

## 2017-04-24 DIAGNOSIS — Z9181 History of falling: Secondary | ICD-10-CM | POA: Diagnosis not present

## 2017-04-24 DIAGNOSIS — Z483 Aftercare following surgery for neoplasm: Secondary | ICD-10-CM | POA: Diagnosis not present

## 2017-04-24 DIAGNOSIS — C61 Malignant neoplasm of prostate: Secondary | ICD-10-CM | POA: Diagnosis not present

## 2017-04-24 DIAGNOSIS — R338 Other retention of urine: Secondary | ICD-10-CM | POA: Diagnosis not present

## 2017-04-24 DIAGNOSIS — Z4682 Encounter for fitting and adjustment of non-vascular catheter: Secondary | ICD-10-CM | POA: Diagnosis not present

## 2017-05-02 DIAGNOSIS — R338 Other retention of urine: Secondary | ICD-10-CM | POA: Diagnosis not present

## 2017-05-07 DIAGNOSIS — N401 Enlarged prostate with lower urinary tract symptoms: Secondary | ICD-10-CM | POA: Diagnosis not present

## 2017-05-07 DIAGNOSIS — R338 Other retention of urine: Secondary | ICD-10-CM | POA: Diagnosis not present

## 2017-08-17 DIAGNOSIS — N5201 Erectile dysfunction due to arterial insufficiency: Secondary | ICD-10-CM | POA: Diagnosis not present

## 2017-08-17 DIAGNOSIS — R972 Elevated prostate specific antigen [PSA]: Secondary | ICD-10-CM | POA: Diagnosis not present

## 2017-08-17 DIAGNOSIS — N400 Enlarged prostate without lower urinary tract symptoms: Secondary | ICD-10-CM | POA: Diagnosis not present

## 2017-10-11 ENCOUNTER — Other Ambulatory Visit: Payer: Self-pay | Admitting: *Deleted

## 2017-10-11 NOTE — Patient Outreach (Signed)
   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 [facility] RN CM left HIPAA compliant voicemail message along with CM's contact info.   Conditions: partial edentulous, prostatectomy, edema seen on [facility] comprehensive physical exam and first health risk assessment    Plan: [facility] RN CM sent an unsuccessful outreach letter and scheduled this patient for another call attempt within 4 business days  Lania Zawistowski L. Lavina Hamman, RN, BSN, [place] Coordinator Office number [phone] Mobile number [phone]  Main [facility] number [phone] Fax number [phone]

## 2017-10-12 ENCOUNTER — Ambulatory Visit: Payer: Self-pay | Admitting: *Deleted

## 2017-10-15 ENCOUNTER — Ambulatory Visit: Payer: Self-pay | Admitting: *Deleted

## 2017-10-16 ENCOUNTER — Other Ambulatory Visit: Payer: Self-pay | Admitting: *Deleted

## 2017-10-16 NOTE — Patient Outreach (Signed)
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 [place] [address] [phone] - Dan Mathis states he liked this office ans had been there for a colonoscopy previously   Dentists  [facility] [place] dr Lady Gary [place] [address] [phone] [id number], [address], [place]  [place], [place] [address] [phone]  orthopedic Lorn Junes MD + a list of 8 other providers  [place], [place] [address] [phone]   Cm discussed co pays for providers and particularly specialists  Dan Mathis voiced appreciation and states he will call to make his own appointments around his schedule  Social Dan Jupin reports living alone with support from a brother and son. He prefers MD appointments near his home/address.He reports being independent with all his care but with transportation concerns at intervals  Conditions: BPH, He denies CKD He states he believes his urinary issues were related to BPH, partial edentulous, prostatectomy, edema seen on [facility] comprehensive physical exam and first health risk assessment    Advance directives: Denies need for assist with for advance directives   Medications; denies concerns with taking medications as prescribed, affording medications, side effects of medications and questions about medications  Consent: [facility] RN CM reviewed [facility] services with patient. He denies need of services from [facility]/Telephonic RN CM, pharmacy, health coach, NP or SW at this time   Plan: [place] CM will close case at this time as patient has been assessed and no needs identified.   [facility] RN CM sent a successful outreach letter as discussed with [facility] brochure enclosed for review   Kimberly L. Lavina Hamman, RN, BSN, [place] Coordinator Office number [phone] Mobile number [phone]  Main [facility] number [phone] Fax number [phone] Address: [address], [place], [place] [address] Phone: [phone]

## 2020-06-22 ENCOUNTER — Encounter: Payer: Self-pay | Admitting: Gastroenterology

## 2021-06-20 ENCOUNTER — Ambulatory Visit: Payer: PPO | Admitting: Family Medicine
